# Patient Record
Sex: Female | Born: 1935 | Race: White | Hispanic: No | Marital: Married | Smoking: Never smoker
Health system: Southern US, Community
[De-identification: ages and names within clinical notes are randomized; demographics above are authoritative.]

## PROBLEM LIST (undated history)

## (undated) DIAGNOSIS — I10 Essential (primary) hypertension: Secondary | ICD-10-CM

## (undated) DIAGNOSIS — M810 Age-related osteoporosis without current pathological fracture: Secondary | ICD-10-CM

## (undated) DIAGNOSIS — G4700 Insomnia, unspecified: Secondary | ICD-10-CM

## (undated) DIAGNOSIS — M1990 Unspecified osteoarthritis, unspecified site: Secondary | ICD-10-CM

## (undated) DIAGNOSIS — E538 Deficiency of other specified B group vitamins: Secondary | ICD-10-CM

## (undated) DIAGNOSIS — E039 Hypothyroidism, unspecified: Secondary | ICD-10-CM

## (undated) DIAGNOSIS — IMO0002 Reserved for concepts with insufficient information to code with codable children: Secondary | ICD-10-CM

## (undated) DIAGNOSIS — F0280 Dementia in other diseases classified elsewhere without behavioral disturbance: Secondary | ICD-10-CM

## (undated) DIAGNOSIS — F419 Anxiety disorder, unspecified: Secondary | ICD-10-CM

## (undated) DIAGNOSIS — G309 Alzheimer's disease, unspecified: Secondary | ICD-10-CM

## (undated) DIAGNOSIS — J301 Allergic rhinitis due to pollen: Secondary | ICD-10-CM

## (undated) DIAGNOSIS — G609 Hereditary and idiopathic neuropathy, unspecified: Secondary | ICD-10-CM

## (undated) DIAGNOSIS — E785 Hyperlipidemia, unspecified: Secondary | ICD-10-CM

## (undated) DIAGNOSIS — N390 Urinary tract infection, site not specified: Secondary | ICD-10-CM

## (undated) DIAGNOSIS — D126 Benign neoplasm of colon, unspecified: Secondary | ICD-10-CM

## (undated) DIAGNOSIS — N360 Urethral fistula: Secondary | ICD-10-CM

## (undated) DIAGNOSIS — K5900 Constipation, unspecified: Secondary | ICD-10-CM

## (undated) HISTORY — DX: Dementia in other diseases classified elsewhere, unspecified severity, without behavioral disturbance, psychotic disturbance, mood disturbance, and anxiety: F02.80

## (undated) HISTORY — DX: Anxiety disorder, unspecified: F41.9

## (undated) HISTORY — DX: Benign neoplasm of colon, unspecified: D12.6

## (undated) HISTORY — DX: Hyperlipidemia, unspecified: E78.5

## (undated) HISTORY — DX: Urinary tract infection, site not specified: N39.0

## (undated) HISTORY — DX: Allergic rhinitis due to pollen: J30.1

## (undated) HISTORY — DX: Constipation, unspecified: K59.00

## (undated) HISTORY — DX: Deficiency of other specified B group vitamins: E53.8

## (undated) HISTORY — DX: Reserved for concepts with insufficient information to code with codable children: IMO0002

## (undated) HISTORY — DX: Insomnia, unspecified: G47.00

## (undated) HISTORY — DX: Essential (primary) hypertension: I10

## (undated) HISTORY — DX: Hereditary and idiopathic neuropathy, unspecified: G60.9

## (undated) HISTORY — DX: Age-related osteoporosis without current pathological fracture: M81.0

## (undated) HISTORY — DX: Urethral fistula: N36.0

## (undated) HISTORY — DX: Unspecified osteoarthritis, unspecified site: M19.90

## (undated) HISTORY — DX: Hypothyroidism, unspecified: E03.9

## (undated) HISTORY — DX: Alzheimer's disease, unspecified: G30.9

---
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
## 1970-09-01 HISTORY — PX: TUBAL LIGATION: SHX77

## 1999-12-01 HISTORY — PX: COLONOSCOPY W/ BIOPSIES AND POLYPECTOMY: SHX1376

## 1999-12-03 ENCOUNTER — Encounter (INDEPENDENT_AMBULATORY_CARE_PROVIDER_SITE_OTHER): Payer: Self-pay

## 1999-12-03 ENCOUNTER — Ambulatory Visit (HOSPITAL_COMMUNITY): Admission: RE | Admit: 1999-12-03 | Discharge: 1999-12-03 | Payer: Self-pay | Admitting: Gastroenterology

## 2000-08-18 ENCOUNTER — Other Ambulatory Visit: Admission: RE | Admit: 2000-08-18 | Discharge: 2000-08-18 | Payer: Self-pay | Admitting: *Deleted

## 2001-09-15 ENCOUNTER — Other Ambulatory Visit: Admission: RE | Admit: 2001-09-15 | Discharge: 2001-09-15 | Payer: Self-pay | Admitting: *Deleted

## 2003-03-29 ENCOUNTER — Ambulatory Visit (HOSPITAL_COMMUNITY): Admission: RE | Admit: 2003-03-29 | Discharge: 2003-03-29 | Payer: Self-pay | Admitting: Gastroenterology

## 2003-11-01 ENCOUNTER — Other Ambulatory Visit: Admission: RE | Admit: 2003-11-01 | Discharge: 2003-11-01 | Payer: Self-pay | Admitting: Family Medicine

## 2005-09-01 DIAGNOSIS — N360 Urethral fistula: Secondary | ICD-10-CM

## 2005-09-01 DIAGNOSIS — K5900 Constipation, unspecified: Secondary | ICD-10-CM

## 2005-09-01 DIAGNOSIS — M810 Age-related osteoporosis without current pathological fracture: Secondary | ICD-10-CM

## 2005-09-01 DIAGNOSIS — D126 Benign neoplasm of colon, unspecified: Secondary | ICD-10-CM

## 2005-09-01 DIAGNOSIS — N390 Urinary tract infection, site not specified: Secondary | ICD-10-CM

## 2005-09-01 DIAGNOSIS — IMO0002 Reserved for concepts with insufficient information to code with codable children: Secondary | ICD-10-CM

## 2005-09-01 DIAGNOSIS — G4700 Insomnia, unspecified: Secondary | ICD-10-CM

## 2005-09-01 HISTORY — DX: Urinary tract infection, site not specified: N39.0

## 2005-09-01 HISTORY — DX: Urethral fistula: N36.0

## 2005-09-01 HISTORY — DX: Insomnia, unspecified: G47.00

## 2005-09-01 HISTORY — DX: Benign neoplasm of colon, unspecified: D12.6

## 2005-09-01 HISTORY — DX: Reserved for concepts with insufficient information to code with codable children: IMO0002

## 2005-09-01 HISTORY — DX: Age-related osteoporosis without current pathological fracture: M81.0

## 2005-09-01 HISTORY — DX: Constipation, unspecified: K59.00

## 2006-09-01 DIAGNOSIS — J301 Allergic rhinitis due to pollen: Secondary | ICD-10-CM

## 2006-09-01 HISTORY — DX: Allergic rhinitis due to pollen: J30.1

## 2008-05-10 HISTORY — PX: COLONOSCOPY W/ BIOPSIES: SHX1374

## 2009-01-17 DIAGNOSIS — G609 Hereditary and idiopathic neuropathy, unspecified: Secondary | ICD-10-CM | POA: Insufficient documentation

## 2009-01-17 HISTORY — DX: Hereditary and idiopathic neuropathy, unspecified: G60.9

## 2010-05-03 ENCOUNTER — Encounter: Admission: RE | Admit: 2010-05-03 | Discharge: 2010-05-03 | Payer: Self-pay | Admitting: Family Medicine

## 2011-01-17 NOTE — Op Note (Signed)
Sonya Robertson, Sonya Robertson                           ACCOUNT NO.:  [id number]   MEDICAL RECORD NO.:  [id number]                   PATIENT TYPE:  AMB   LOCATION:  ENDO                                 FACILITY:  [facility]   PHYSICIAN:  Anselmo Rod, M.D.               DATE OF BIRTH:  03-12-1933   DATE OF PROCEDURE:  03/29/2003  DATE OF DISCHARGE:                                 OPERATIVE REPORT   PROCEDURE PERFORMED:  Screening colonoscopy.   ENDOSCOPIST:  Charna Elizabeth, M.D.   INSTRUMENT USED:  Olympus pediatric adjustable colonoscope changed to an  adult colonoscope.   INDICATIONS FOR PROCEDURE:  The patient is a 77 year old white female with a  history of adenomatous polyps removed in 2001.  Undergoing a repeat  colonoscopy. Rule out recurrent polyps.  There is questionable family  history of colon cancer in the paternal grandmother and family history of  breast cancer in a maternal aunt.   PREPROCEDURE PREPARATION:  Informed consent was procured from the patient.  The patient was fasted for eight hours prior to the procedure and prepped  with a bottle of magnesium citrate and a gallon of GoLYTELY the night prior  to the procedure.  She also received 400 mg of Cipro intravenously for MV  prophylaxis.   PREPROCEDURE PHYSICAL:  The patient had stable vital signs.  Neck supple.  Chest clear to auscultation.  S1 and S2 regular.  Abdomen soft with normal  bowel sounds.   DESCRIPTION OF PROCEDURE:  The patient was placed in left lateral decubitus  position and sedated with 100 mcg of fentanyl and 10 mg of Versed  intravenously.  Once the patient was adequately sedated and maintained on  low flow oxygen and continuous cardiac monitoring, the Olympus video  colonoscope was advanced from the rectum to the hepatic flexure without  difficulty.  The pediatric adjustable colonoscope was then withdrawn and the  adult colonoscope used instead.  The patient's position was changed from the  left  lateral position to the supine position and gentle abdominal pressure  was applied on several occasions to reach the cecum.  The appendicular  orifice and ileocecal valve were clearly visualized and photographed.  There  was evidence of pandiverticulosis with more prominent changes in the left  colon.  Small nonbleeding internal hemorrhoids were seen on retroflexion.  The patient had a very tortuous atonic colon.  The patient tolerated the  procedure well without complications.  No masses or polyps were seen.   IMPRESSION:  1. Pandiverticulosis with more prominent changes in the sigmoid colon.  2. Small nonbleeding internal hemorrhoids.  Very tortuous atonic colon     requiring changing of the scope from the pediatric to an adult size.  3. No masses or polyps seen.   RECOMMENDATIONS:  1. A high fiber diet with 20 to 25g of fiber in the diet, liberal fluid  intake has been recommended.  2. Repeat colorectal cancer screening is recommended in the next five years     unless the patient develops any abnormal symptoms in the interim.  3. Outpatient follow-up as need arises in the future.                                                  Anselmo Rod, M.D.    JNM/MEDQ  D:  03/29/2003  T:  03/29/2003  Job:  [id number]   cc:   Talmadge Coventry, M.D.  [address]  [place]  [place] [address]  Fax: [phone]

## 2011-09-11 ENCOUNTER — Ambulatory Visit (INDEPENDENT_AMBULATORY_CARE_PROVIDER_SITE_OTHER): Payer: BC Managed Care – PPO

## 2011-09-11 DIAGNOSIS — J440 Chronic obstructive pulmonary disease with acute lower respiratory infection: Secondary | ICD-10-CM

## 2012-01-12 ENCOUNTER — Other Ambulatory Visit: Payer: Self-pay | Admitting: Family Medicine

## 2012-01-14 NOTE — Telephone Encounter (Signed)
THIS OK TO CHANGE?

## 2012-03-09 ENCOUNTER — Telehealth: Payer: Self-pay

## 2012-03-09 NOTE — Telephone Encounter (Signed)
Sonya Robertson from [facility] [employer] called to state that the assessment that was faxed back to her did not have the attachment that it stated it should have.  Please review and call Sonya Robertson back at [phone].

## 2012-03-10 ENCOUNTER — Telehealth: Payer: Self-pay

## 2012-03-10 NOTE — Telephone Encounter (Signed)
Dr. Patsy Lager do you remember this form for pt.  If you do, do you remember putting a yellow sticky note on it?  If so i can look through the scan piles to find it to resend.  (I checked in chart and no papers were put in chart)

## 2012-03-10 NOTE — Telephone Encounter (Signed)
LMOM for [facility] to [facility] Stated on machine Pt has not been seen here at our office since 09/11/11. Eileen Stanford

## 2012-03-10 NOTE — Telephone Encounter (Signed)
Sonya Robertson FROM [facility] CALLED IN REGARDS TO THE PATIENT.  DR. Patsy Lager HAD FAXED OVER AN ASSESSMENT.  ON THE FIRST PAGE IT SAID SEE ATTACHMENT.  THEY DID NOT RECEIVE THE ATTACHMENT .  PLEASE CALL HER AT [phone]

## 2012-03-10 NOTE — Telephone Encounter (Signed)
See notes under phone message on 03/10/12.

## 2012-03-10 NOTE — Telephone Encounter (Signed)
LMOM for Sonya Robertson to [facility]

## 2012-03-10 NOTE — Telephone Encounter (Signed)
Sonya Robertson spoke w/ Sonya Robertson and got completed form faxed back to her at [facility] w/ attachment.

## 2012-03-10 NOTE — Telephone Encounter (Signed)
I do not remember this particular form.  Would they like to fax [place] another copy and I will do it again?  Please call [facility] and ask them

## 2012-07-05 ENCOUNTER — Other Ambulatory Visit: Payer: Self-pay | Admitting: Family Medicine

## 2012-07-05 NOTE — Telephone Encounter (Signed)
Chart pulled to PA pool at nurses station [phone]

## 2012-07-06 ENCOUNTER — Other Ambulatory Visit: Payer: Self-pay

## 2012-07-06 MED ORDER — LEVOTHYROXINE SODIUM 88 MCG PO TABS: 88.0000 ug | ORAL_TABLET | Freq: Every day | ORAL | Status: DC

## 2012-07-06 MED ORDER — AMLODIPINE BESYLATE 5 MG PO TABS: 5.0000 mg | ORAL_TABLET | Freq: Every day | ORAL | Status: DC

## 2012-07-08 ENCOUNTER — Telehealth: Payer: Self-pay

## 2012-07-08 ENCOUNTER — Ambulatory Visit (INDEPENDENT_AMBULATORY_CARE_PROVIDER_SITE_OTHER): Payer: Medicare Other | Admitting: Family Medicine

## 2012-07-08 VITALS — BP 128/74 | HR 69 | Temp 97.9°F | Resp 16 | Wt 122.0 lb

## 2012-07-08 DIAGNOSIS — R636 Underweight: Secondary | ICD-10-CM

## 2012-07-08 DIAGNOSIS — E039 Hypothyroidism, unspecified: Secondary | ICD-10-CM

## 2012-07-08 DIAGNOSIS — K1379 Other lesions of oral mucosa: Secondary | ICD-10-CM

## 2012-07-08 DIAGNOSIS — K1370 Unspecified lesions of oral mucosa: Secondary | ICD-10-CM

## 2012-07-08 DIAGNOSIS — I10 Essential (primary) hypertension: Secondary | ICD-10-CM

## 2012-07-08 LAB — COMPREHENSIVE METABOLIC PANEL
Albumin: 4.4 g/dL (ref 3.5–5.2)
CO2: 30 mEq/L (ref 19–32)
Glucose, Bld: 77 mg/dL (ref 70–99)
Potassium: 3.9 mEq/L (ref 3.5–5.3)
Sodium: 141 mEq/L (ref 135–145)
Total Protein: 6.7 g/dL (ref 6.0–8.3)

## 2012-07-08 LAB — POCT CBC
Granulocyte percent: 61.1 %G (ref 37–80)
HCT, POC: 40.7 % (ref 37.7–47.9)
Hemoglobin: 12.6 g/dL (ref 12.2–16.2)
Lymph, poc: 1.4 (ref 0.6–3.4)
MCHC: 31 g/dL — AB (ref 31.8–35.4)
POC Granulocyte: 2.7 (ref 2–6.9)

## 2012-07-08 NOTE — Telephone Encounter (Signed)
Sonya Robertson CALLED AGAIN AND WOULD LIKE A CALL BACK FROM DR COPLAND, THEY ARE THINKING ABOUT COMING IN, BUT WOULD LIKE TO SPEAK WITH HER FIRST. PLEASE CALL [phone]

## 2012-07-08 NOTE — Progress Notes (Signed)
[facility] [address], [place] [address] [phone]  Date:  07/08/2012   Name:  Sonya Robertson   DOB:  06/14/1933   MRN:  [id number]  PCP:  No primary provider on file.    Chief Complaint: Follow-up and Medication Refill   History of Present Illness:  Sonya Robertson is a 78 y.o. very pleasant female patient who presents with the following:  Here today for a follow- up visit. She has been doing well except for a runny nose today.  She has not been seen in about a year and needed to have her labs updated.  She is not fasting today.   She and her husband have moved to [place]- they are in independent living but are enjoying the meals at their new home.    There is no problem list on file for this patient.   Past Medical History  Diagnosis Date  . Arthritis     History reviewed. No pertinent past surgical history.  History  Substance Use Topics  . Smoking status: Never Smoker   . Smokeless tobacco: Not on file  . Alcohol Use: Yes    History reviewed. No pertinent family history.  Allergies not on file  Medication list has been reviewed and updated.  Current Outpatient Prescriptions on File Prior to Visit  Medication Sig Dispense Refill  . amLODipine (NORVASC) 5 MG tablet TAKE 1 TABLET DAILY FOR BLOOD PRESSURE.  30 tablet  0  . amLODipine (NORVASC) 5 MG tablet Take 1 tablet (5 mg total) by mouth daily.  30 tablet  0  . levothyroxine (SYNTHROID, LEVOTHROID) 88 MCG tablet Take 1 tablet (88 mcg total) by mouth daily.  30 tablet  0  . SYNTHROID 88 MCG tablet TAKE (1) TABLET DAILY FOR THYROID.  90 tablet  0    Review of Systems:  As per HPI- otherwise negative. She reports having a good appetite and is eating ok in her opinion.  Her weight has been stable over the last year or so, but has been trending down over the last 5 years.     Physical Examination: Filed Vitals:   07/08/12 1517  BP: 128/74  Pulse: 69  Temp: 97.9 F (36.6 C)    Resp: 16   Filed Vitals:   07/08/12 1517  Weight: 122 lb (55.339 kg)   There is no height on file to calculate BMI. Ideal Body Weight:    GEN: WDWN, NAD, Non-toxic, A & O x 3, slim build  HEENT: Atraumatic, Normocephalic. Neck supple. No masses, No LAD.  Bilateral TM wnl, oropharynx benign- however I do note that her uvula is pulled to the right when she raises her soft palate.  PEERL,EOMI.   Ears and Nose: No external deformity. CV: RRR, No M/G/R. No JVD. No thrill. No extra heart sounds. PULM: CTA B, no wheezes, crackles, rhonchi. No retractions. No resp. distress. No accessory muscle use. ABD: S, NT, ND EXTR: No c/c/e NEURO Normal gait.  PSYCH: Normally interactive. Conversant. Not depressed or anxious appearing.  Calm demeanor.    Assessment and Plan: 1. Hypothyroidism  TSH  2. Underweight  POCT CBC, Comprehensive metabolic panel  3. Soft palate paralysis      Check labs today to ensure we do not need to adjust her thyroid medication.  Encouraged her to add a snack/ ensure shake to her daily diet.  Went over the finding noted above with her and her husband.  They are  not sure if this is new or old and she has not noted anything amiss. She has no trouble swallowing.  Discussed sending her to ENT for further evaluation.  She is not sure if she wants to pursue this further right now- will talk with her when I call regarding her labs in a few days.    Abbe Amsterdam, MD

## 2012-07-08 NOTE — Telephone Encounter (Signed)
PATIENT'S SPOUSE CALLED IN REGARDS TO KNOWING HOW TO GET HIS WIFE'S RX REFILL DONE AND TO SPEAK TO DR.COPLAND OR HER NURSE ABOUT SOME QUESTIONS THAT THEY HAVE. THANK YOU!

## 2012-07-08 NOTE — Telephone Encounter (Signed)
Patient is here now to be seen 

## 2012-07-09 LAB — TSH: TSH: 1.586 u[IU]/mL (ref 0.350–4.500)

## 2012-07-10 ENCOUNTER — Encounter: Payer: Self-pay | Admitting: Family Medicine

## 2012-07-10 MED ORDER — LEVOTHYROXINE SODIUM 88 MCG PO TABS: 88.0000 ug | ORAL_TABLET | Freq: Every day | ORAL | Status: DC

## 2012-07-10 MED ORDER — AMLODIPINE BESYLATE 5 MG PO TABS: 5.0000 mg | ORAL_TABLET | Freq: Every day | ORAL | Status: DC

## 2012-07-10 NOTE — Addendum Note (Signed)
Addended by: Abbe Amsterdam C on: 07/10/2012 05:56 PM   Modules accepted: Orders

## 2012-12-13 ENCOUNTER — Non-Acute Institutional Stay: Payer: Medicare Other | Admitting: Internal Medicine

## 2012-12-13 ENCOUNTER — Encounter: Payer: Self-pay | Admitting: Internal Medicine

## 2012-12-13 VITALS — BP 118/62 | HR 68 | Resp 14 | Ht 65.0 in | Wt 118.0 lb

## 2012-12-13 DIAGNOSIS — G609 Hereditary and idiopathic neuropathy, unspecified: Secondary | ICD-10-CM

## 2012-12-13 DIAGNOSIS — E039 Hypothyroidism, unspecified: Secondary | ICD-10-CM

## 2012-12-13 DIAGNOSIS — I10 Essential (primary) hypertension: Secondary | ICD-10-CM

## 2012-12-13 DIAGNOSIS — G309 Alzheimer's disease, unspecified: Secondary | ICD-10-CM

## 2012-12-13 DIAGNOSIS — E785 Hyperlipidemia, unspecified: Secondary | ICD-10-CM

## 2012-12-13 DIAGNOSIS — E538 Deficiency of other specified B group vitamins: Secondary | ICD-10-CM

## 2012-12-13 DIAGNOSIS — F0280 Dementia in other diseases classified elsewhere without behavioral disturbance: Secondary | ICD-10-CM

## 2012-12-19 ENCOUNTER — Encounter: Payer: Self-pay | Admitting: Internal Medicine

## 2012-12-19 DIAGNOSIS — E538 Deficiency of other specified B group vitamins: Secondary | ICD-10-CM | POA: Insufficient documentation

## 2012-12-19 DIAGNOSIS — I10 Essential (primary) hypertension: Secondary | ICD-10-CM | POA: Insufficient documentation

## 2012-12-19 DIAGNOSIS — F0280 Dementia in other diseases classified elsewhere without behavioral disturbance: Secondary | ICD-10-CM | POA: Insufficient documentation

## 2012-12-19 DIAGNOSIS — E039 Hypothyroidism, unspecified: Secondary | ICD-10-CM | POA: Insufficient documentation

## 2012-12-19 DIAGNOSIS — E785 Hyperlipidemia, unspecified: Secondary | ICD-10-CM | POA: Insufficient documentation

## 2012-12-19 NOTE — Progress Notes (Signed)
Date: 12/19/2012  MRN:  [id number] Name:  Sonya Robertson Sex:  female Age:  79 y.o. DOB:27-Mar-1933   [facility] #:                      [phone] Facility/Room; [facility] Level Of Care: Independent with the assistance of her husband Provider: A. Sanna Porcaro  Emergency Contacts: Contact Information   Name Relation Home Work Mobile   Defrank,DAVID  [phone]        Code Status: Living will  Allergies: Allergies  Allergen Reactions  . Codeine   . Demerol (Meperidine)   . Sulfa Antibiotics      Chief Complaint  Patient presents with  . Medical Managment of Chronic Issues    Alzheimer's, anxiety, thyroid. Saw Claudette 10/20/12 to Re-Est     HPI: Patient is here to establish herself in our practice. She recently had an exam done by Maxwell Marion, NP.  There are few complaints today. She is accompanied by her husband. She swells throughout the exam and says she is doing fine. Her husband has no specific complaints either. Multiple chronic issues are outlined in her past medical history. They all seem to be stable at this time.  Past Medical History  Diagnosis Date  . Arthritis   . Alzheimer's disease   . Unspecified hereditary and idiopathic peripheral neuropathy 01/17/2009  . Allergic rhinitis due to pollen 2008  . Hyperlipidemia   . Urinary tract infection, site not specified 2007  . Urethral fistula 2007  . Benign neoplasm of colon 2007  . Anxiety   . Unspecified constipation 2007  . Degeneration of intervertebral disc, site unspecified 2007  . Senile osteoporosis 2007  . Insomnia, unspecified 2007  . Unspecified hypothyroidism   . B12 deficiency   . Unspecified essential hypertension     Past Surgical History  Procedure Laterality Date  . Tonsillectomy and adenoidectomy  1940  . Tubal ligation  1972  . Colonoscopy w/ biopsies and polypectomy  12/1999    adenomatous Dr. Arty Baumgartner     Procedures: 12/1999 Colonoscopy Dr. Arty Baumgartner 10-07-2001 Bone Density Dr. Yolanda Bonine 2004  Colonoscopy Dr. Arty Baumgartner 01-17-05 Mammogram 12/2005 Mammogram 03/20/2006 Bone Density osteopenic 11/06/2006 Right Foot x-ray degenerative changes, osteopenia, on acute fracture or dislocation 03/01/2007-Mammogram negative  03/09/2008 Mammogram negative 03/09/2008 Bone Density Osteopenic 05/10/2008 Colonoscopy pandiverticulosis with more prominent changes in the sigmoid colon. Prominent internal hemorrhoids. One small sessile polyp Dr. Charna Elizabeth 03/15/2009 Mammogram negative Consultants:   GI- Dr Loreta Ave Neurology- Dr Anne Hahn Orthopedic Dr. Lunette Stands   Current Outpatient Prescriptions  Medication Sig Dispense Refill  . aspirin 81 MG tablet Take 81 mg by mouth daily.      . Cyanocobalamin (NASCOBAL) 500 MCG/0.1ML SOLN Place 500 mcg into the nose. One spray intranasally once a week      . Multiple Vitamin (MULTIVITAMIN) tablet Take 1 tablet by mouth daily.      Marland Kitchen amLODipine (NORVASC) 5 MG tablet Take 1 tablet (5 mg total) by mouth daily.  30 tablet  11  . donepezil (ARICEPT) 23 MG TABS tablet 23 mg. Take one tablet daily for memory      . levothyroxine (SYNTHROID, LEVOTHROID) 88 MCG tablet Take 1 tablet (88 mcg total) by mouth daily.  30 tablet  11   No current facility-administered medications for this visit.    Immunization History  Administered Date(s) Administered  . Influenza Whole 06/01/2012  . Pneumococcal Polysaccharide 09/01/1996  . Td 09/01/2004  .  Zoster 01/08/2007     Diet: regular  History  Substance Use Topics  . Smoking status: Former Smoker    Quit date: 09/19/1953  . Smokeless tobacco: Never Used  . Alcohol Use: Yes     Comment: wine with dinner    Family History  Problem Relation Age of Onset  . Cancer Mother     COLON  . Heart disease Father     MI     Constitutional: negative Eyes: positive for contacts/glasses Ears, nose, mouth, throat, and face: positive for hearing loss, negative for earaches and snoring Respiratory: negative Cardiovascular:  negative Gastrointestinal: positive for constipation, negative for diarrhea, dyspepsia, melena and nausea Genitourinary:positive for urinary incontinence, negative for dysuria and frequency Integument/breast: negative Hematologic/lymphatic: negative Musculoskeletal:positive for arthralgias and bone pain, negative for bone pain and neck pain Neurological: positive for memory problems, negative for dizziness, tremors and vertigo Behavioral/Psych: negative Endocrine: positive for hypothyroidism Allergic/Immunologic: negative  Vital signs: BP 118/62  Pulse 68  Resp 14  Ht 5\' 5"  (1.651 m)  Wt 118 lb (53.524 kg)  BMI 19.64 kg/m2   General Appearance:    Alert, cooperative, no distress, appears stated age  Head:    Normocephalic, without obvious abnormality, atraumatic  Eyes:    PERRL, conjunctiva/corneas clear, EOM's intact, fundi    benign, both eyes.Wearing prescription lenses.  Ears:    Normal TM's and external ear canals, both ears  Nose:   Nares normal, septum midline, mucosa normal, no drainage    or sinus tenderness  Throat:   Lips, mucosa, and tongue normal; teeth and gums normal  Neck:   Supple, symmetrical, trachea midline, no adenopathy;    thyroid:  no enlargement/tenderness/nodules; no carotid   bruit or JVD  Back:     Symmetric, no curvature, ROM normal, no CVA tenderness  Lungs:     Clear to auscultation bilaterally, respirations unlabored  Chest Wall:    No tenderness or deformity   Heart:    Regular rate and rhythm, S1 and S2 normal, no murmur, rub   or gallop  Breast Exam:    No tenderness, masses, or nipple abnormality  Abdomen:     Soft, non-tender, bowel sounds active all four quadrants,    no masses, no organomegaly  Genitalia:    Normal female without lesion, discharge or tenderness  Rectal:    Normal tone, normal prostate, no masses or tenderness;   guaiac negative stool  Spine: Scoliosis   Extremities:   Extremities normal, atraumatic, no cyanosis or  edema  Pulses:   absent popliteal pulses. Bilaterally absent dorsalis pedis pulses. Trace edema on the left. Superficial varicosities bilaterally.   Skin:   Skin color, texture, turgor normal, no rashes or lesions  Lymph nodes:   Cervical, supraclavicular, and axillary nodes normal  Neurologic:   CNII-XII intact, normal strength, sensation and reflexes    Throughout. Loss of memory. Heavily dependent on her husband     Screening Score  MMS  [id number]  PHQ2 0  PHQ9     Fall Risk    BIMS    Annual summary: Hospitalizations: none   Infection History: None of significance Functional assessment: Patient is able to dress herself and attend to her own toileting. She is dependent upon her husband for guidance, remembering to take her medications, and meal preparation. Areas of potential improvement: Functioning at highest achievable level Rehabilitation Potential: None likely Prognosis for survival: Good Plan: Problem List Items Addressed This Visit  ICD-9-CM   Alzheimer's disease - Primary     This is the primary diagnosis at this time. This has been progressive over at least the last 4 years. Her Husband has cared for her. Her needs have increased significantly during this time. The move to [facility] was in part due to her progressive dementia    Relevant Medications      donepezil (ARICEPT) 23 MG TABS tablet   Unspecified hereditary and idiopathic peripheral neuropathy    Patient has a mild neuropathy. Is not painful. It has been attributed to possible B12 deficiency in the past.    Relevant Medications      donepezil (ARICEPT) 23 MG TABS tablet   Unspecified essential hypertension    Under excellent control at this time no active treatment.    Relevant Medications      aspirin 81 MG tablet   B12 deficiency    Patient is taking Nascobal    Hyperlipidemia    Followup lab as needed    Relevant Medications      aspirin 81 MG tablet   Unspecified hypothyroidism    Currently  using Levoxyl 88 mcg daily. Followup lab is needed.

## 2012-12-19 NOTE — Patient Instructions (Signed)
Continue current medications  Return in 6 months

## 2013-01-10 ENCOUNTER — Telehealth: Payer: Self-pay | Admitting: Nurse Practitioner

## 2013-01-10 NOTE — Telephone Encounter (Signed)
R/S appt

## 2013-01-18 ENCOUNTER — Ambulatory Visit: Payer: Self-pay | Admitting: Nurse Practitioner

## 2013-01-29 ENCOUNTER — Other Ambulatory Visit: Payer: Self-pay | Admitting: Neurology

## 2013-02-16 ENCOUNTER — Encounter: Payer: Self-pay | Admitting: Geriatric Medicine

## 2013-02-16 ENCOUNTER — Non-Acute Institutional Stay: Payer: Medicare Other | Admitting: Geriatric Medicine

## 2013-02-16 VITALS — BP 128/60 | HR 56 | Ht 65.0 in | Wt 120.0 lb

## 2013-02-16 DIAGNOSIS — I10 Essential (primary) hypertension: Secondary | ICD-10-CM

## 2013-02-16 DIAGNOSIS — G309 Alzheimer's disease, unspecified: Secondary | ICD-10-CM

## 2013-02-16 NOTE — Assessment & Plan Note (Signed)
Functional status with minimal decline- spouse supervises showering, all other ADLS independent. . Language skills nearly intact, some difficulty word finding. No behavioral issues; no wandering. Will start participation in [employer] next month, family has discussed caregiver assistance at home if needed in the future. Repeat MMSE next visit w/ Dr.Green

## 2013-02-16 NOTE — Assessment & Plan Note (Signed)
Amlodipine stopped 11/2012. BP satisfactory.

## 2013-02-16 NOTE — Progress Notes (Signed)
Patient ID: Sonya Robertson, female   DOB: 1933/04/13, 79 y.o.   MRN: [id number] [facility] [phone])  Chief Complaint  Patient presents with  . Medical Managment of Chronic Issues    thyroid, anxiety, Alzheimers    HPI: This is a 79 y.o. female resident of [facility], Independent Living section evaluated today for management of ongoing medical issues.  Pt's spouse reports memory/ functional issues are pretty stable. He notes pt has difficult time with new environments; requires assistance with mechanics. No acute issues at home.  Allergies  Allergies  Allergen Reactions  . Codeine   . Demerol (Meperidine)   . Sulfa Antibiotics    Medications  Reviewed  Data Reviewed    Lab:  Reviewed 07/2012    Review of Systems  DATA OBTAINED: from patient, family member (spouse) GENERAL: Feels well   No fevers, fatigue, change in appetite or weight SKIN: No itch, rash or open wounds EYES: No eye pain,   No change in vision EARS: No earache, tinnitus, change in hearing, wears Hearing aides NOSE: No congestion, drainage or bleeding MOUTH/THROAT: No mouth or tooth pain  No sore throat No difficulty chewing or swallowing RESPIRATORY: No cough, wheezing, SOB CARDIAC: No chest pain, palpitations  No edema. GI: No abdominal pain  No N/V/D or constipation  No heartburn or reflux  GU: No dysuria, frequency or urgency  No change in urine volume or character MUSCULOSKELETAL: No joint pain, swelling or stiffness  Chronic lower rt. back pain  No muscle ache, pain, weakness  Gait is steady  No recent falls.  NEUROLOGIC: No dizziness, fainting, headache,  No change in mental status(dementia).  PSYCHIATRIC: No feelings of anxiety, depression Sleeps well.  No behavior issue.   Physical Exam Filed Vitals:   02/16/13 1547  BP: 128/60  Pulse: 56  Height: 5\' 5"  (1.651 m)  Weight: 120 lb (54.432 kg)   Body mass index is 19.97 kg/(m^2).  GENERAL APPEARANCE: No  acute distress, appropriately groomed, normal body habitus. Alert, pleasant, conversant. HEAD: Normocephalic, atraumatic EYES: Conjunctiva/lids clear. Pupils round, reactive. Marland Kitchen  EARS: External exam WNL, Aide in each ear  Hearing grossly normal. NOSE: No deformity or discharge. MOUTH/THROAT: Lips w/o lesions. Oral mucosa, tongue moist, w/o lesion. Oropharynx w/o redness or lesions.  NECK: Supple, full ROM. No thyroid tenderness, enlargement or nodule LYMPHATICS: No head, neck or supraclavicular adenopathy RESPIRATORY: Breathing is even, unlabored. Lung sounds are clear and full.  CARDIOVASCULAR: Heart RRR. No murmur or extra heart sounds  EDEMA: No peripheral edema. GASTROINTESTINAL: Abdomen is soft, non-tender, not distended w/ normal bowel sounds.  MUSCULOSKELETAL: Moves all extremities with full ROM, strength and tone. Back is without kyphosis, spinal process tenderness. Scoliosis present. Gait is steady NEUROLOGIC: Oriented to time, place, person. Speech clear, no tremor. Marland Kitchen PSYCHIATRIC: Mood and affect appropriate to situation  ASSESSMENT/PLAN  .Alzheimer's disease Functional status with minimal decline- spouse supervises showering, all other ADLS independent. . Language skills nearly intact, some difficulty word finding. No behavioral issues; no wandering. Will start participation in [employer] next month, family has discussed caregiver assistance at home if needed in the future. Repeat MMSE next visit w/ Dr.Green  Unspecified essential hypertension Amlodipine stopped 11/2012. BP satisfactory.   Follow up: 06/2013 Dr.Green w/ lab. 09/2013 [facility]  Casanova Schurman T.Theia Dezeeuw, NP-C 02/16/2013

## 2013-02-25 ENCOUNTER — Other Ambulatory Visit: Payer: Self-pay | Admitting: Neurology

## 2013-04-13 ENCOUNTER — Encounter: Payer: Self-pay | Admitting: Nurse Practitioner

## 2013-04-13 ENCOUNTER — Ambulatory Visit (INDEPENDENT_AMBULATORY_CARE_PROVIDER_SITE_OTHER): Payer: Medicare Other | Admitting: Nurse Practitioner

## 2013-04-13 VITALS — BP 113/59 | HR 56 | Ht 66.0 in | Wt 117.0 lb

## 2013-04-13 DIAGNOSIS — F0280 Dementia in other diseases classified elsewhere without behavioral disturbance: Secondary | ICD-10-CM

## 2013-04-13 MED ORDER — DONEPEZIL HCL 23 MG PO TABS: 23.0000 mg | ORAL_TABLET | Freq: Every day | ORAL | Status: DC

## 2013-04-13 NOTE — Progress Notes (Signed)
Reason for visit follow up for memory loss  HPI: [name] returns for followup her daughter and her husband. She was last seen by Dr. Vickey Huger 07/23/2012.  She has problems expressing herself and sometimes seems to have difficulties understanding, too. She loses objects, misplacing things in the house, word finding difficulties have made it harder to complete a sentence  and sometimes she forgets what she intended to say at all. 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 She has two Master's degrees.  Patient is retired, as is her husband.  She has routines for meals, bedtimes. She doesn't forget meals,  likes to travel. Her husband reports her being socially less interactive and no longer involved in her favorite clubs,  less cooking, less independent  due to back pain, hearing loss, neuropathy were all blamed, but he feels these last two years have shown dementia.  She gave the checkbook balancing to her husband, uses the computer much less.  She is afraid of making a mistake.  Her daughter noted that her mother appears anxious, tense, worried, and her physical conditions seem not to be contributing to this. Anxious, she misses cues, for ex. searching around for the  the handbag right in front of her. MRI of the brain with mild atrophy and SVD. EEG with slowing.    Also with  B 12 deficiency and takes the nasal spray.  04/13/13:  Patient  returns for MMSE now at 15 -30. GDS=0. She is living at [facility]. No longer cooking for themselves , has personal trainer 3 times a week and goes to adult center for enrichment 2 times a week. Aricept was increased to 23 mg at last visit and memory is stable according to daughter and husband   ROS:  Weight loss, ringing in the ears, allergies, anxiety, memory loss   Medications Current Outpatient Prescriptions on File Prior to Visit  Medication Sig Dispense Refill  . aspirin 81 MG tablet Take 81 mg  by mouth daily.      . Cyanocobalamin (NASCOBAL) 500 MCG/0.1ML SOLN Place 500 mcg into the nose. One spray intranasally once a week      . donepezil (ARICEPT) 23 MG TABS tablet TAKE 1 TABLET ONCE DAILY FOR MEMORY.  90 tablet  1  . levothyroxine (SYNTHROID, LEVOTHROID) 88 MCG tablet Take 1 tablet (88 mcg total) by mouth daily.  30 tablet  11  . Multiple Vitamin (MULTIVITAMIN) tablet Take 1 tablet by mouth daily.       No current facility-administered medications on file prior to visit.    Allergies  Allergies  Allergen Reactions  . Codeine   . Demerol [Meperidine]   . Sulfa Antibiotics     Physical Exam General: well developed, frail female  seated, in no evident distress Head: head normocephalic and atraumatic. Oropharynx benign Neck: supple with no carotid  bruits Cardiovascular: regular rate and rhythm, no murmurs  Neurologic Exam Mental Status: Awake and  alert. MMSE 15/30 missing items in orientation, calculation, recall and unable to copy a figure . Follows all commands. Speech and language normal   Cranial Nerves: Pupils equal, briskly reactive to light. Extraocular movements full without nystagmus. Visual fields full to confrontation. Right ptosis pre-existing Hearing intact and symmetric to finger snap. Facial sensation intact. Face, tongue, palate move normally and symmetrically. Neck flexion and extension normal.  Motor: Normal bulk and tone. Normal strength in all tested extremity muscles.No focal weakness Sensory.: intact  to touch and pinprick and vibratory.  Coordination: Rapid alternating movements normal in all extremities. Finger-to-nose and heel-to-shin performed accurately bilaterally. No dysmetria Gait and Station: Arises from chair without difficulty. Stance is normal. Gait demonstrates normal stride length and balance . No assistive device .  Reflexes: 2+ and symmetric. Toes downgoing.     ASSESSMENT: Dementia currently on Aricept 23 mg daily without side  effects. Denies hallucinations    PLAN: Pt will continue Aricept at current dose, will refill Continue exercise for overall good health F/U in 6 months, next visit with Dr. Vickey Huger PQRS data obtained  Sonya Robertson, GNP-BC APRN

## 2013-04-13 NOTE — Patient Instructions (Addendum)
Pt will continue Aricept at current dose, will refill Continue exercise for overall good health F/U in 6 months

## 2013-05-11 ENCOUNTER — Non-Acute Institutional Stay: Payer: Medicare Other | Admitting: Geriatric Medicine

## 2013-05-11 ENCOUNTER — Encounter: Payer: Self-pay | Admitting: Geriatric Medicine

## 2013-05-11 VITALS — BP 144/70 | HR 56 | Temp 96.3°F | Ht 66.0 in | Wt 116.0 lb

## 2013-05-11 DIAGNOSIS — K5900 Constipation, unspecified: Secondary | ICD-10-CM

## 2013-05-11 NOTE — Progress Notes (Signed)
Patient ID: Sonya Robertson, female   DOB: 1933-06-23, 79 y.o.   MRN: [id number] [facility] [phone])  Chief Complaint  Patient presents with  . Medical Managment of Chronic Issues    hard little stools for one month     HPI: This is a 79 y.o. female resident of [facility],  Independent Living  section.  Evaluation is requested today due to abnormal stools. Patient reports she passes hard brown stools several times a day. Sometimes feels the urge to evacuate but has not passed any stool. Patient denies any abdominal pain nausea vomiting or change in appetite. Spouse reports that patient does not drink very much water during the day, just sips.   Allergies  Allergies  Allergen Reactions  . Codeine   . Demerol [Meperidine]   . Sulfa Antibiotics    Medications  Reviewed  Data Reviewed    Lab:  Reviewed 07/2012    Review of Systems  DATA OBTAINED: from patient, family member (spouse) GENERAL: Feels well   No fevers, fatigue, change in appetite or weight GI: No abdominal pain  No N/V/D or constipation  No heartburn or reflux  GU: No dysuria, frequency or urgency  No change in urine volume or character MUSCULOSKELETAL: No joint pain, swelling or stiffness  Chronic lower rt. back pain  No muscle ache, pain, weakness  Gait is steady  No recent falls.  NEUROLOGIC: No dizziness, fainting, headache,  No change in mental status(dementia).  PSYCHIATRIC: No feelings of anxiety, depression Sleeps well.  No behavior issue.   Physical Exam Filed Vitals:   05/11/13 1512  BP: 144/70  Pulse: 56  Temp: 96.3 F (35.7 C)  TempSrc: Oral  Height: 5\' 6"  (1.676 m)  Weight: 116 lb (52.617 kg)   Body mass index is 18.73 kg/(m^2).  GENERAL APPEARANCE: No acute distress, appropriately groomed, normal body habitus. Alert, pleasant, conversant. HEAD: Normocephalic, atraumatic EYES: Conjunctiva/lids clear.   EARS:  Aide in each ear  Hearing grossly  normal. RESPIRATORY: Breathing is even, unlabored. GASTROINTESTINAL: Abdomen is soft, non-tender, not distended w/ normal bowel sounds on right, quiet on left side.  MUSCULOSKELETAL: Moves all extremities with full ROM, strength and tone. Back is without kyphosis, spinal process tenderness. Scoliosis present. Gait is steady NEUROLOGIC: Oriented to time, place, person. Speech clear, no tremor. Marland Kitchen PSYCHIATRIC: Mood and affect appropriate to situation  ASSESSMENT/PLAN  .No problem-specific assessment & plan notes found for this encounter.  Follow up: 06/2013 Dr.Green w/ lab. 09/2013 [facility]  Rande Dario T.Arabel Barcenas, NP-C 05/11/2013

## 2013-06-07 LAB — HEPATIC FUNCTION PANEL
ALK PHOS: 36 U/L (ref 25–125)
ALK PHOS: 36 U/L (ref 25–125)
ALT: 15 U/L (ref 7–35)
ALT: 15 U/L (ref 7–35)
AST: 20 U/L (ref 13–35)
AST: 20 U/L (ref 13–35)
Bilirubin, Total: 0.7 mg/dL

## 2013-06-07 LAB — BASIC METABOLIC PANEL
BUN: 21 mg/dL (ref 4–21)
BUN: 21 mg/dL (ref 4–21)
CREATININE: 0.7 mg/dL (ref 0.5–1.1)
Creatinine: 0.7 mg/dL (ref 0.5–1.1)
GLUCOSE: 80 mg/dL
Glucose: 80 mg/dL
POTASSIUM: 3.9 mmol/L (ref 3.4–5.3)
POTASSIUM: 3.9 mmol/L (ref 3.4–5.3)
SODIUM: 141 mmol/L (ref 137–147)
Sodium: 141 mmol/L (ref 137–147)

## 2013-06-07 LAB — LIPID PANEL
CHOLESTEROL: 274 mg/dL — AB (ref 0–200)
CHOLESTEROL: 274 mg/dL — AB (ref 0–200)
HDL: 112 mg/dL — AB (ref 35–70)
HDL: 112 mg/dL — AB (ref 35–70)
LDL CALC: 149 mg/dL
LDL Cholesterol: 149 mg/dL
LDL/HDL RATIO: 2.4
TRIGLYCERIDES: 67 mg/dL (ref 40–160)
Triglycerides: 67 mg/dL (ref 40–160)

## 2013-06-07 LAB — TSH
TSH: 3.2 u[IU]/mL (ref 0.41–5.90)
TSH: 3.2 u[IU]/mL (ref 0.41–5.90)

## 2013-06-13 ENCOUNTER — Encounter: Payer: Self-pay | Admitting: Internal Medicine

## 2013-06-13 ENCOUNTER — Non-Acute Institutional Stay: Payer: Medicare Other | Admitting: Internal Medicine

## 2013-06-13 VITALS — BP 140/64 | HR 60 | Ht 66.0 in | Wt 120.0 lb

## 2013-06-13 DIAGNOSIS — I10 Essential (primary) hypertension: Secondary | ICD-10-CM

## 2013-06-13 DIAGNOSIS — E785 Hyperlipidemia, unspecified: Secondary | ICD-10-CM

## 2013-06-13 DIAGNOSIS — F0280 Dementia in other diseases classified elsewhere without behavioral disturbance: Secondary | ICD-10-CM

## 2013-06-13 DIAGNOSIS — K5900 Constipation, unspecified: Secondary | ICD-10-CM

## 2013-06-13 DIAGNOSIS — E039 Hypothyroidism, unspecified: Secondary | ICD-10-CM

## 2013-06-13 NOTE — Progress Notes (Signed)
  Subjective:    Patient ID: Sonya Robertson, female    DOB: 1933/08/06, 79 y.o.   MRN: [id number]  HPI  Unspecified essential hypertension: controlled  Alzheimer's disease; nchanged  Hyperlipidemia: very high HDL and high LDL  Unspecified constipation: improved  Unspecified hypothyroidism: corrected on supplement   Current Outpatient Prescriptions on File Prior to Visit  Medication Sig Dispense Refill  . aspirin 81 MG tablet Take 81 mg by mouth daily.      . Cyanocobalamin (NASCOBAL) 500 MCG/0.1ML SOLN Place 500 mcg into the nose. One spray intranasally once a week      . donepezil (ARICEPT) 23 MG TABS tablet Take 1 tablet (23 mg total) by mouth daily.  90 tablet  1  . levothyroxine (SYNTHROID, LEVOTHROID) 88 MCG tablet Take 1 tablet (88 mcg total) by mouth daily.  30 tablet  11  . Multiple Vitamin (MULTIVITAMIN) tablet Take 1 tablet by mouth daily.       No current facility-administered medications on file prior to visit.    Review of Systems  Constitutional: Negative for fever, chills, diaphoresis, activity change, appetite change, fatigue and unexpected weight change.  HENT: Negative for congestion, ear discharge, ear pain, hearing loss, mouth sores and nosebleeds.   Eyes: Positive for visual disturbance.  Respiratory: Negative for apnea, choking and wheezing.   Cardiovascular: Negative for chest pain, palpitations and leg swelling.  Gastrointestinal: Negative for abdominal pain and abdominal distention.  Endocrine: Negative.   Genitourinary: Negative.   Musculoskeletal: Positive for arthralgias and back pain. Negative for gait problem, myalgias, neck pain and neck stiffness.  Skin: Negative for color change.  Neurological:       Memory deficit. Absent vibratory sense in toes, but positive at ankles.  Psychiatric/Behavioral: Negative.        Objective:   Physical Exam  Constitutional: She appears well-developed and well-nourished. No distress.  Thin.  HENT:  Head:  Normocephalic and atraumatic.  Right Ear: External ear normal.  Left Ear: External ear normal.  Nose: Nose normal.  Mouth/Throat: Oropharynx is clear and moist.  Eyes: Conjunctivae and EOM are normal. Pupils are equal, round, and reactive to light.  Corrective  Lenses.  Neck: Neck supple. No JVD present. No tracheal deviation present. No thyromegaly present.  Cardiovascular: Normal rate, regular rhythm and normal heart sounds.  Exam reveals no gallop and no friction rub.   No murmur heard. Pulmonary/Chest: No respiratory distress. She has no wheezes. She has no rales. She exhibits no tenderness.  Abdominal: She exhibits no distension and no mass. There is no tenderness.  Musculoskeletal: She exhibits no edema and no tenderness.  Fingers are stiff with arthritis. Unable to fully close hands.  Lymphadenopathy:    She has no cervical adenopathy.  Neurological: She is alert. No cranial nerve deficit.  06/13/13 MMSE: 14/30. Failed clock drawing  Skin: Skin is dry. No rash noted. No erythema. No pallor.  Psychiatric: She has a normal mood and affect. Her behavior is normal. Thought content normal.      LAB REVIEW 06/07/13 CMP: normal  Lipids: TC  274, trig 67, HDL 112, LDL 149  TSH 3.204    Assessment & Plan:  Unspecified essential hypertension:  controlled  Alzheimer's disease; unchanged  Hyperlipidemia: stable. High HDL.  Unspecified constipation; improved  Unspecified hypothyroidism: corrected on levothyroxine.

## 2013-06-13 NOTE — Progress Notes (Signed)
Failed clock drawing  

## 2013-06-13 NOTE — Patient Instructions (Signed)
Continue current medication.

## 2013-07-07 ENCOUNTER — Other Ambulatory Visit: Payer: Self-pay

## 2013-07-19 ENCOUNTER — Other Ambulatory Visit: Payer: Self-pay | Admitting: Neurology

## 2013-08-19 ENCOUNTER — Other Ambulatory Visit: Payer: Self-pay | Admitting: Family Medicine

## 2013-08-19 ENCOUNTER — Other Ambulatory Visit: Payer: Self-pay | Admitting: Internal Medicine

## 2013-09-21 ENCOUNTER — Non-Acute Institutional Stay: Payer: Medicare Other | Admitting: Geriatric Medicine

## 2013-09-21 VITALS — BP 104/60 | HR 60 | Ht 66.0 in | Wt 125.0 lb

## 2013-09-21 DIAGNOSIS — G309 Alzheimer's disease, unspecified: Principal | ICD-10-CM

## 2013-09-21 DIAGNOSIS — J310 Chronic rhinitis: Secondary | ICD-10-CM

## 2013-09-21 DIAGNOSIS — E039 Hypothyroidism, unspecified: Secondary | ICD-10-CM

## 2013-09-21 DIAGNOSIS — E785 Hyperlipidemia, unspecified: Secondary | ICD-10-CM

## 2013-09-21 DIAGNOSIS — K5900 Constipation, unspecified: Secondary | ICD-10-CM

## 2013-09-21 DIAGNOSIS — F0280 Dementia in other diseases classified elsewhere without behavioral disturbance: Secondary | ICD-10-CM

## 2013-09-21 DIAGNOSIS — I10 Essential (primary) hypertension: Secondary | ICD-10-CM

## 2013-09-21 DIAGNOSIS — Z66 Do not resuscitate: Secondary | ICD-10-CM

## 2013-09-21 NOTE — Progress Notes (Signed)
Patient ID: Sonya Robertson, female   DOB: 07/03/1933, 80 y.o.   MRN: [id number]  [facility] [phone])  Code status: DNR  Contact Information   Name Relation Home Work Mobile   Schwalm,DAVID  [id number]        Chief Complaint  Patient presents with  . Medical Managment of Chronic Issues    Alzheimer's, blood pressure    HPI: This is a 80 y.o. female resident of [facility],  Independent Living    section evaluated today for management of ongoing medical issues.    Last visit:  01/2013:   Alzheimer's disease Functional status with minimal decline- spouse supervises showering, all other ADLS independent. . Language skills nearly intact, some difficulty word finding. No behavioral issues; no wandering. Will start participation in [employer] next month, family has discussed caregiver assistance at home if needed in the future. Repeat MMSE next visit w/ Dr.Green  Unspecified essential hypertension Amlodipine stopped 11/2012. BP satisfactory.  Since last visit patient does not have any acute medical issues the continues to reside in her independent living home at [facility] with her spouse who is her major source of support. There's been no significant change in her cognitive or functional status; she continues to be independent with ADLs, requires assistance with all IADLs. She continues to exercise regularly with a personal trainer 3 times a week as well as daily walks. She and her spouse continue to have some social interactions.   Allergies  Allergies  Allergen Reactions  . Codeine   . Demerol [Meperidine]   . Sulfa Antibiotics      Medication List       This list is accurate as of: 09/21/13 11:59 PM.  Always use your most recent med list.               aspirin 81 MG tablet  Take 81 mg by mouth daily.     donepezil 23 MG Tabs tablet  Commonly known as:  ARICEPT  Take 1 tablet (23 mg total) by mouth daily.     multivitamin tablet    Take 1 tablet by mouth daily.     NASCOBAL 500 MCG/0.1ML Soln  Generic drug:  Cyanocobalamin  USE 1 SPRAY INTRANASALLY ONCE A WEEK.     polyethylene glycol packet  Commonly known as:  MIRALAX / GLYCOLAX  Take 17 g by mouth. One packet in 4-8 oz liquid once a week for laxative     SYNTHROID 88 MCG tablet  Generic drug:  levothyroxine  TAKE (1) TABLET DAILY FOR THYROID.         DATA REVIEWED Laboratory Studies: Lab Results- [facility] 06/07/2013  Component Value   GLUCOSE 80       CHOL 274*   TRIG 67   HDL 112*   LDLCALC 149       ALT 15   AST 20   NA 141   K 3.9   CREATININE 0.7   BUN 21        REVIEW OF SYSTEMS DATA OBTAINED: from patient, nurse,  family member (spouse) GENERAL: Feels well   No fevers, fatigue, change in appetite or weight SKIN: No itch, rash or open wounds EYES: No eye pain, dryness or itching  No change in vision EARS: No earache, tinnitus, wears hearing aids NOSE: No congestion or bleeding.  Drippy nose MOUTH/THROAT: No mouth or tooth pain   No sore throat    No difficulty chewing or swallowing RESPIRATORY: No  cough, wheezing, SOB CARDIAC: No chest pain, palpitations  No edema. GI: No abdominal pain  No N/V/D  No heartburn or reflux  constipation managed with combination of Metamucil, water and diet GU: No dysuria, frequency or urgency  No change in urine volume or character  MUSCULOSKELETAL: No joint pain, swelling or stiffness  No back pain  No muscle ache, pain, weakness  Gait is steady  No recent falls.  NEUROLOGIC: No dizziness, fainting, headache,  No change in mental status (dementia).  PSYCHIATRIC: No feelings of anxiety, depression   Sleeps well.  No behavior issue.    PHYSICAL EXAM  Filed Vitals:   09/21/13 1531  BP: 104/60  Pulse: 60  Height: 5\' 6"  (1.676 m)  Weight: 125 lb (56.7 kg)   Body mass index is 20.19 kg/(m^2).  GENERAL APPEARANCE: No acute distress, appropriately groomed, thin body habitus. Alert, pleasant,  conversant. Exhibits some difficulty with word finding SKIN: No diaphoresis, rash, unusual lesions, wounds HEAD: Normocephalic, atraumatic EYES: Conjunctiva/lids clear. Pupils round, reactive. Marland Kitchen  EARS: External exam WNL, Aide in each ear   Hearing grossly normal. NOSE: No deformity or discharge. MOUTH/THROAT: Lips w/o lesions. Oral mucosa, tongue moist, w/o lesion. Oropharynx w/o redness or lesions.  NECK: Supple, full ROM. No thyroid tenderness, enlargement or nodule LYMPHATICS: No head, neck or supraclavicular adenopathy RESPIRATORY: Breathing is even, unlabored. Lung sounds are clear and full.  CARDIOVASCULAR: Heart RRR. No murmur or extra heart sounds  EDEMA: No peripheral edema.  GASTROINTESTINAL: Abdomen is soft, non-tender, not distended w/ normal bowel sounds.  MUSCULOSKELETAL: Moves all extremities with full ROM, strength and tone.Scoliosis present. Gait is steady NEUROLOGIC: Oriented to time, place, person. Speech clear, somewhat stilted, no tremor. PSYCHIATRIC: Mood and affect appropriate to situation   ASSESSMENT/PLAN  .Unspecified essential hypertension Blood pressure remains well-controlled off medication  Alzheimer's disease Functional status appears unchanged, mild decrease in cognition is evident by language deficit. No home management issues identified. Patient's appetite and weight are stable. Continue current medication, repeat MMSE at next visit  Rhinitis, chronic Patient's only complaint today is chronic drippy nose. Unclear if this is related to allergic or atrophic rhinitis. Will try  Antihistamine, if no improvement after 1 week instructed spouse to stop the medication  Hyperlipidemia Most recent lipid levels are satisfactory, no need for medication.  Unspecified hypothyroidism Most recent TSH within normal limits, patient appears euthyroid. Continue current supplement dose.  Unspecified constipation Constipation is being managed with combination of  Metamucil and water in diet. Have reinforced the need for adequate water intake when using Metamucil. Also suggest adding prune juice to diet.   Follow up:  3 months Dr. Nyoka Cowden, MMSE/Clock test, 6 month CTK, EV  Raelynn Corron T.Kamaal Cast, NP-C 09/21/2013

## 2013-09-22 ENCOUNTER — Encounter: Payer: Self-pay | Admitting: Geriatric Medicine

## 2013-09-22 DIAGNOSIS — J310 Chronic rhinitis: Secondary | ICD-10-CM | POA: Insufficient documentation

## 2013-09-22 MED ORDER — LORATADINE 10 MG PO TABS: 10.0000 mg | ORAL_TABLET | Freq: Every day | ORAL | Status: DC

## 2013-09-22 NOTE — Assessment & Plan Note (Signed)
Constipation is being managed with combination of Metamucil and water in diet. Have reinforced the need for adequate water intake when using Metamucil. Also suggest adding prune juice to diet.

## 2013-09-22 NOTE — Assessment & Plan Note (Signed)
Blood pressure remains well-controlled off medication

## 2013-09-22 NOTE — Assessment & Plan Note (Signed)
Patient's only complaint today is chronic drippy nose. Unclear if this is related to allergic or atrophic rhinitis. Will try  Antihistamine, if no improvement after 1 week instructed spouse to stop the medication

## 2013-09-22 NOTE — Assessment & Plan Note (Signed)
Most recent TSH within normal limits, patient appears euthyroid. Continue current supplement dose.

## 2013-09-22 NOTE — Assessment & Plan Note (Signed)
Functional status appears unchanged, mild decrease in cognition is evident by language deficit. No home management issues identified. Patient's appetite and weight are stable. Continue current medication, repeat MMSE at next visit

## 2013-09-22 NOTE — Assessment & Plan Note (Signed)
Most recent lipid levels are satisfactory, no need for medication.

## 2013-10-14 ENCOUNTER — Other Ambulatory Visit: Payer: Self-pay | Admitting: Neurology

## 2013-10-19 ENCOUNTER — Encounter: Payer: Self-pay | Admitting: Neurology

## 2013-10-19 ENCOUNTER — Ambulatory Visit (INDEPENDENT_AMBULATORY_CARE_PROVIDER_SITE_OTHER): Payer: Medicare Other | Admitting: Neurology

## 2013-10-19 VITALS — BP 130/70 | HR 72 | Resp 18 | Wt 126.0 lb

## 2013-10-19 DIAGNOSIS — F0391 Unspecified dementia with behavioral disturbance: Secondary | ICD-10-CM

## 2013-10-19 DIAGNOSIS — F03918 Unspecified dementia, unspecified severity, with other behavioral disturbance: Secondary | ICD-10-CM

## 2013-10-19 MED ORDER — MEMANTINE HCL ER 7 & 14 & 21 &28 MG PO CP24: 28.0000 mg | ORAL_CAPSULE | Freq: Once | ORAL | Status: DC

## 2013-10-19 MED ORDER — MEMANTINE HCL ER 28 MG PO CP24: 28.0000 mg | ORAL_CAPSULE | Freq: Once | ORAL | Status: DC

## 2013-10-19 NOTE — Patient Instructions (Signed)
Memantine Tablets  What is this medicine?  MEMANTINE (MEM an teen) is used to treat dementia caused by Alzheimer's disease.  This medicine may be used for other purposes; ask your health care provider or pharmacist if you have questions.  COMMON BRAND NAME(S): Namenda  What should I tell my health care provider before I take this medicine?  They need to know if you have any of these conditions:  -difficulty passing urine  -kidney disease  -liver disease  -seizures  -an unusual or allergic reaction to memantine, other medicines, foods, dyes, or preservatives  -pregnant or trying to get pregnant  -breast-feeding  How should I use this medicine?  Take this medicine by mouth with a glass of water. Follow the directions on the prescription label. You may take this medicine with or without food. Take your doses at regular intervals. Do not take your medicine more often than directed. Continue to take your medicine even if you feel better. Do not stop taking except on the advice of your doctor or health care professional.  Talk to your pediatrician regarding the use of this medicine in children. Special care may be needed.  Overdosage: If you think you have taken too much of this medicine contact a poison control center or emergency room at once.  NOTE: This medicine is only for you. Do not share this medicine with others.  What if I miss a dose?  If you miss a dose, take it as soon as you can. If it is almost time for your next dose, take only that dose. Do not take double or extra doses. If you do not take your medicine for several days, contact your health care provider. Your dose may need to be changed.  What may interact with this medicine?  -acetazolamide  -amantadine  -cimetidine  -dextromethorphan  -dofetilide  -hydrochlorothiazide  -ketamine  -metformin  -methazolamide  -quinidine  -ranitidine  -sodium bicarbonate  -triamterene  This list may not describe all possible interactions. Give your health care provider a  list of all the medicines, herbs, non-prescription drugs, or dietary supplements you use. Also tell them if you smoke, drink alcohol, or use illegal drugs. Some items may interact with your medicine.  What should I watch for while using this medicine?  Visit your doctor or health care professional for regular checks on your progress. Check with your doctor or health care professional if there is no improvement in your symptoms or if they get worse.  You may get drowsy or dizzy. Do not drive, use machinery, or do anything that needs mental alertness until you know how this drug affects you. Do not stand or sit up quickly, especially if you are an older patient. This reduces the risk of dizzy or fainting spells. Alcohol can make you more drowsy and dizzy. Avoid alcoholic drinks.  What side effects may I notice from receiving this medicine?  Side effects that you should report to your doctor or health care professional as soon as possible:  -allergic reactions like skin rash, itching or hives, swelling of the face, lips, or tongue  -agitation or a feeling of restlessness  -depressed mood  -dizziness  -hallucinations  -redness, blistering, peeling or loosening of the skin, including inside the mouth  -seizures  -vomiting  Side effects that usually do not require medical attention (report to your doctor or health care professional if they continue or are bothersome):  -constipation  -diarrhea  -headache  -nausea  -trouble sleeping  This   F). Throw away any unused medicine after the expiration date. NOTE: This sheet is a summary. It may not cover all possible information. If you have questions about this  medicine, talk to your doctor, pharmacist, or health care provider.  2014, [employer]. (2013-06-06 14:10:42)

## 2013-10-19 NOTE — Progress Notes (Signed)
Reason for visit : A follow up for memory loss;   Mrs. Patin is an established patient of our practice, has been followed for memory loss. The patient undergoes serial Mini-Mental Status Examination upon visits and seems to have more difficulties to oriented to time and place, date- score today 11/30 points and generated 5 words on animal fluency. She was not able to copy an image. The geriatric depression scale was endorsed at 0 points.    She also has more trouble generating words and has actually cut becomes quite sparse with verbal output.      HPI: Ms. Claw returns for followup her daughter and her husband. She was last seen by Dr. Brett Fairy 07/23/2012.  04/13/13:  Patient  returns for MMSE now at 12 -30. GDS=0. She is living at [facility].  No longer cooking for themselves , has personal trainer 3 times a week and goes to adult center for enrichment 2 times a week. Aricept was increased to 23 mg at last visit and memory is stable according to daughter and husband  She has problems expressing herself and sometimes seems to have difficulties understanding, too. She loses objects, misplacing things in the house, word finding difficulties have made it harder to complete a sentence  and sometimes she forgets what she intended to say at all. 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 She has two Master's degrees.  Patient is retired, as is her husband.  She has routines for meals, bedtimes. She doesn't forget meals,  likes to travel. Her husband reports her being socially less interactive and no longer involved in her favorite clubs,  less cooking, less independent  due to back pain, hearing loss, neuropathy were all blamed, but he feels these last two years have shown dementia.  She gave the checkbook balancing to her husband, uses the computer much less.  She is afraid of making a mistake.  Her daughter noted that her mother appears  anxious, tense, worried, and her physical conditions seem not to be contributing to this. Anxious, she misses cues, for ex. searching around for the  the handbag right in front of her.  MRI of the brain with mild atrophy and SVD. EEG with slowing.    Also with  B 12 deficiency and takes the nasal spray.  ROS:  Weight loss, ringing in the ears, allergies, anxiety, memory loss   Medications Current Outpatient Prescriptions on File Prior to Visit  Medication Sig Dispense Refill  . aspirin 81 MG tablet Take 81 mg by mouth daily.      Marland Kitchen donepezil (ARICEPT) 23 MG TABS tablet Take 1 tablet (23 mg total) by mouth daily.  90 tablet  1  . Multiple Vitamin (MULTIVITAMIN) tablet Take 1 tablet by mouth daily.      Marland Kitchen NASCOBAL 500 MCG/0.1ML SOLN USE 1 SPRAY INTRANASALLY ONCE A WEEK.  3.9 mL  4  . polyethylene glycol (MIRALAX / GLYCOLAX) packet Take 17 g by mouth. One packet in 4-8 oz liquid once a week for laxative      . SYNTHROID 88 MCG tablet TAKE (1) TABLET DAILY FOR THYROID.  30 tablet  3   No current facility-administered medications on file prior to visit.    Allergies  Allergies  Allergen Reactions  . Codeine   . Demerol [Meperidine]   . Sulfa Antibiotics     Physical Exam General: well developed, frail female  seated, in no evident  distress, the patient is slender , well groomed and friendly.  Head: head normocephalic and atraumatic. Oropharynx benign Neck: supple with no carotid  bruits Cardiovascular: regular rate and rhythm, no murmurs  Neurologic Exam Mental Status: Awake and  alert. MMSE 12/30 missing items in orientation, calculation, recall and unable to copy a figure . Follows all commands. Speech and language normal  - she has hearing aids.  Cranial Nerves: Pupils equal, briskly reactive to light. Extraocular movements full without nystagmus. Visual fields full to confrontation.  Right ptosis pre-existing,  Hearing intact with aids , Tinnitus,  and symmetric to finger snap.   Facial sensation intact. Face, tongue, palate move normally and symmetrically. Neck flexion and extension normal.  Motor: Normal bulk and tone. Normal strength in all tested extremity muscles.No focal weakness Sensory.: intact to touch and pinprick and vibratory. The patient shows no signs of extinction or neglect.  She did not verbally name the left or right side in orienting questions , but pointed to the side affected by fine touch testing.  Coordination: Rapid alternating movements normal in all extremities. Finger-to-nose and heel-to-shin performed accurately bilaterally. No dysmetria. Mild resting resting tremor.  Gait and Station: Arises from chair without difficulty. Stance is normal based , Gait demonstrates normal stride length and balance . She is able to perform a tandem gait and stands stable for Romberg. ,  No assistive device needed, .  Reflexes: 2+ and symmetric. Toes downgoing.     ASSESSMENT: Dementia currently on Aricept 23 mg daily without side effects. Denies hallucinations    PLAN: Pt will continue Aricept at current dose, will refill Continue exercise for overall good health F/U in 6 months, next visit with NP Hassell Done.  Kyleen Villatoro, MD

## 2013-12-12 ENCOUNTER — Non-Acute Institutional Stay: Payer: Medicare Other | Admitting: Internal Medicine

## 2013-12-12 ENCOUNTER — Encounter: Payer: Self-pay | Admitting: Internal Medicine

## 2013-12-12 VITALS — BP 124/70 | HR 60 | Wt 122.0 lb

## 2013-12-12 DIAGNOSIS — I10 Essential (primary) hypertension: Secondary | ICD-10-CM

## 2013-12-12 DIAGNOSIS — F0280 Dementia in other diseases classified elsewhere without behavioral disturbance: Secondary | ICD-10-CM

## 2013-12-12 DIAGNOSIS — G309 Alzheimer's disease, unspecified: Principal | ICD-10-CM

## 2013-12-12 DIAGNOSIS — E785 Hyperlipidemia, unspecified: Secondary | ICD-10-CM

## 2013-12-12 DIAGNOSIS — E039 Hypothyroidism, unspecified: Secondary | ICD-10-CM

## 2013-12-12 NOTE — Progress Notes (Signed)
Patient ID: Sonya Robertson, female   DOB: 1933-03-04, 80 y.o.   MRN: [id number]    Location:  [facility]   Place of Service: OFFICE    Allergies  Allergen Reactions  . Codeine   . Demerol [Meperidine]   . Sulfa Antibiotics     Chief Complaint  Patient presents with  . Medical Managment of Chronic Issues    blood pressure, thyroid, anxiety, Alzheimer's    HPI:  Alzheimer's disease: slowly progressing  Unspecified essential hypertension: controlled  Hyperlipidemia: recheck next visit  Unspecified hypothyroidism: recheck next visit    Medications: Patient's Medications  New Prescriptions   No medications on file  Previous Medications   ASPIRIN 81 MG TABLET    Take 81 mg by mouth daily.   DONEPEZIL (ARICEPT) 23 MG TABS TABLET    Take 1 tablet (23 mg total) by mouth daily.   MEMANTINE HCL ER (NAMENDA XR) 28 MG CP24    Take 28 mg by mouth once.   NASCOBAL 500 MCG/0.1ML SOLN    USE 1 SPRAY INTRANASALLY ONCE A WEEK.   PSYLLIUM (METAMUCIL) 58.6 % POWDER    Take 1 packet by mouth daily.   SYNTHROID 88 MCG TABLET    TAKE (1) TABLET DAILY FOR THYROID.  Modified Medications   No medications on file  Discontinued Medications   MEMANTINE HCL ER 7 & 14 & 21 &28 MG CP24    Take 28 mg by mouth once.   MULTIPLE VITAMIN (MULTIVITAMIN) TABLET    Take 1 tablet by mouth daily.   POLYETHYLENE GLYCOL (MIRALAX / GLYCOLAX) PACKET    Take 17 g by mouth. One packet in 4-8 oz liquid once a week for laxative     Review of Systems  Constitutional: Negative for fever, chills, diaphoresis, activity change, appetite change, fatigue and unexpected weight change.  HENT: Negative for congestion, ear discharge, ear pain, hearing loss, mouth sores and nosebleeds.   Eyes: Positive for visual disturbance.  Respiratory: Negative for apnea, choking and wheezing.   Cardiovascular: Negative for chest pain, palpitations and leg swelling.  Gastrointestinal: Negative for abdominal pain and abdominal distention.    Endocrine: Negative.   Genitourinary: Negative.   Musculoskeletal: Positive for arthralgias and back pain. Negative for gait problem, myalgias, neck pain and neck stiffness.  Skin: Negative for color change.  Neurological:       Memory deficit. Absent vibratory sense in toes, but positive at ankles.  Psychiatric/Behavioral: Negative.     Filed Vitals:   12/12/13 1550  BP: 124/70  Pulse: 60  Weight: 122 lb (55.339 kg)   Physical Exam  Constitutional: She appears well-developed and well-nourished. No distress.  Thin.  HENT:  Head: Normocephalic and atraumatic.  Right Ear: External ear normal.  Left Ear: External ear normal.  Nose: Nose normal.  Mouth/Throat: Oropharynx is clear and moist.  Eyes: Conjunctivae and EOM are normal. Pupils are equal, round, and reactive to light.  Corrective  Lenses.  Neck: Neck supple. No JVD present. No tracheal deviation present. No thyromegaly present.  Cardiovascular: Normal rate, regular rhythm and normal heart sounds.  Exam reveals no gallop and no friction rub.   No murmur heard. Pulmonary/Chest: No respiratory distress. She has no wheezes. She has no rales. She exhibits no tenderness.  Abdominal: She exhibits no distension and no mass. There is no tenderness.  Musculoskeletal: She exhibits no edema and no tenderness.  Fingers are stiff with arthritis. Unable to fully close hands.  Lymphadenopathy:  She has no cervical adenopathy.  Neurological: She is alert. No cranial nerve deficit.  06/13/13 MMSE: 14/30. Failed clock drawing  Skin: Skin is dry. No rash noted. No erythema. No pallor.  Psychiatric: She has a normal mood and affect. Her behavior is normal. Thought content normal.     Labs reviewed: Nursing Home on 09/21/2013  Component Date Value Ref Range Status  . Glucose 06/07/2013 80   Final  . Potassium 06/07/2013 3.9  3.4 - 5.3 mmol/L Final  . Sodium 06/07/2013 141  137 - 147 mmol/L Final  . LDl/HDL Ratio 06/07/2013 2.4    Final  . Cholesterol 06/07/2013 274* 0 - 200 mg/dL Final  . LDL Cholesterol 06/07/2013 149   Final  . Alkaline Phosphatase 06/07/2013 36  25 - 125 U/L Final  . ALT 06/07/2013 15  7 - 35 U/L Final  . AST 06/07/2013 20  13 - 35 U/L Final  . TSH 06/07/2013 3.20  0.41 - 5.90 uIU/mL Final  . BUN 06/07/2013 21  4 - 21 mg/dL Final  . Creatinine 06/07/2013 0.7  0.5 - 1.1 mg/dL Final  . Triglycerides 06/07/2013 67  40 - 160 mg/dL Final  . HDL 06/07/2013 112* 35 - 70 mg/dL Final      Assessment/Plan 1. Alzheimer's disease Continue current medications  2. Unspecified essential hypertension controlled  3. Hyperlipidemia Recheck next visit  4. Unspecified hypothyroidism Recheck next visit

## 2013-12-23 ENCOUNTER — Other Ambulatory Visit: Payer: Self-pay | Admitting: Internal Medicine

## 2014-01-01 LAB — BASIC METABOLIC PANEL
Creatinine: 0.7 mg/dL (ref 0.5–1.1)
Glucose: 113 mg/dL
Potassium: 3.9 mmol/L (ref 3.4–5.3)
Sodium: 137 mmol/L (ref 137–147)

## 2014-01-01 LAB — CBC AND DIFFERENTIAL
HCT: 40 % (ref 36–46)
Hemoglobin: 13.1 g/dL (ref 12.0–16.0)
PLATELETS: 158 10*3/uL (ref 150–399)
WBC: 9.7 10*3/mL

## 2014-01-04 ENCOUNTER — Encounter: Payer: Self-pay | Admitting: Geriatric Medicine

## 2014-01-04 ENCOUNTER — Non-Acute Institutional Stay: Payer: Medicare Other | Admitting: Geriatric Medicine

## 2014-01-04 VITALS — BP 100/62 | HR 92 | Temp 97.8°F | Wt 121.0 lb

## 2014-01-04 DIAGNOSIS — K5900 Constipation, unspecified: Secondary | ICD-10-CM

## 2014-01-04 DIAGNOSIS — N390 Urinary tract infection, site not specified: Secondary | ICD-10-CM

## 2014-01-04 MED ORDER — SENNA 8.6 MG PO TABS: 1.0000 | ORAL_TABLET | Freq: Every day | ORAL | Status: DC

## 2014-01-04 MED ORDER — CIPROFLOXACIN HCL 250 MG PO TABS: 250.0000 mg | ORAL_TABLET | Freq: Two times a day (BID) | ORAL | Status: DC

## 2014-01-04 NOTE — Progress Notes (Signed)
Patient ID: Sonya Robertson, female   DOB: 10/13/1932, 80 y.o.   MRN: [id number]   [facility] [phone]   Contact Information   Name Relation Home Work Maurice  [phone]         Chief Complaint  Patient presents with  . ER follow-up    on 01/01/14 went to [facility] ER for acuute cystitis without hematuria.  With husband today    HPI: This is a 80 y.o. female resident of [place], Independent Living  section.  Evaluation is requested today in follow up of ED visit in [place], [place] 12/31/13. Telemetry was current concern because patient appeared weaker, had progressively decreased p.o. Intake. ED evaluation included laboratory studies and urinalysis. Patient was given some IV fluids and prescribed cephalexin t.i.d. for 7 days for presumed UTI. Spouse reports patient is moving around a bit better, p.o. intake is fair, having some difficulty getting her to drink much fluids. She did complain of lower abdominal pain the night before last.    Allergies  Allergen Reactions  . Codeine   . Demerol [Meperidine]   . Sulfa Antibiotics     MEDICATIONS -     Medication List       This list is accurate as of: 01/04/14 10:17 AM.  Always use your most recent med list.               aspirin 81 MG tablet  Take 81 mg by mouth daily.     ciprofloxacin 250 MG tablet  Commonly known as:  CIPRO  Take 1 tablet (250 mg total) by mouth 2 (two) times daily.     donepezil 23 MG Tabs tablet  Commonly known as:  ARICEPT  Take 1 tablet (23 mg total) by mouth daily.     Memantine HCl ER 28 MG Cp24  Commonly known as:  NAMENDA XR  Take 28 mg by mouth once.     NASCOBAL 500 MCG/0.1ML Soln  Generic drug:  Cyanocobalamin  USE 1 SPRAY INTRANASALLY ONCE A WEEK.     senna 8.6 MG Tabs tablet  Commonly known as:  SENOKOT  Take 1 tablet (8.6 mg total) by mouth at bedtime.     SYNTHROID 88 MCG tablet  Generic drug:  levothyroxine  TAKE (1)  TABLET DAILY FOR THYROID.         DATA REVIEWED  Radiologic Exams:   Laboratory Studies:  Lab Results  Component Value Date   NA 141 06/07/2013   K 3.9 06/07/2013   GLU 80 06/07/2013   BUN 21 06/07/2013   CREATININE 0.7 06/07/2013   Lab Results  Component Value Date   WBC 9.7 01/01/2014   HGB 13.1 01/01/2014   HCT 40 01/01/2014   PLT 158 01/01/2014    Lab Results  Component Value Date   NA 137 01/01/2014   K 3.9 01/01/2014   GLU 113 01/01/2014   CREATININE 0.7 01/01/2014   eGFR  76  01/01/2014: Urine culture: Greater than 100,000 colonies Escherichia coli  intermediate susceptibility to cefazolin, sensitive to Cipro    REVIEW OF SYSTEMS  DATA OBTAINED: from patient,  family member(spouse) GENERAL: Feels "OK"  No fatigue, change in appetite, or weight .  Continues to work with a exercise trainer 3 times a week. Spouse reports he will be requiring home care assistance for hygiene bathing starting tomorrow. RESPIRATORY: No cough, wheezing, SOB CARDIAC: No chest pain, palpitations. No edema GI: No abdominal pain  No Nausea,vomiting,diarrhea or constipation  No heartburn or reflux  GU: Patient denies dysuria, frequency or incontinence. Did complain of lower, pain the night before last MUSCULOSKELETAL: No joint pain, swelling or stiffness   No back pain    No muscle ache, pain, weakness    Gait is steady    No recent falls  NEUROLOGIC: No dizziness, fainting, headache, No change in mental status (dementia, significant short-term memory loss) PSYCHIATRIC: No reported increased anxiety, depression  Sleeps well   No behavior issue   PHYSICAL EXAM Filed Vitals:   01/04/14 0946  BP: 100/62  Pulse: 92  Temp: 97.8 F (36.6 C)  TempSrc: Oral  Weight: 121 lb (54.885 kg)   Body mass index is 19.54 kg/(m^2).  GENERAL APPEARANCE: No acute distress, appropriately groomed, normal body habitus Alert, pleasant, conversant. SKIN: No diaphoresis, rash, wound HEAD: Normocephalic, atraumatic EYES:  Conjunctiva/lids clear  RESPIRATORY: Breathing is even, unlabored  Lung sounds are clear and full  CARDIOVASCULAR: Heart RRR   No murmur or extra heart sounds   EDEMA: No peripheral edema   GASTROINTESTINAL: Abdomen is soft, non-tender, not distended w/ normal bowel sounds. No hepatic or splenic enlargement. No mass, ventral or inguinal hernia. GENITOURINARY: Bladder non tender, not distended. MUSCULOSKELETAL.  Significant kyphosis and scoliosis Gait is slow, steady PSYCHIATRIC: Mood and affect appropriate to situation    ASSESSMENT/PLAN  Constipation Spouse reports he has difficulty having patient drink enough water. I recommended stopping Metamucil and using Senokot-S instead at night.  UTI (urinary tract infection) UTI diagnosed 3 days ago, empiric cephalexin not best choice for this infection. Will change to Cipro. Patient's symptoms were significant weakness and progressively decreased p.o. Intake. Spouse has noted patient requires increased assistance with hygiene, he has arranged for home care assistance.    Family/ staff Communication:  Discussed with souse reasons for change of antibiotic. Encouraged home care assistance; as this patient's dementia progresses she will require more help with daily activities.    Labs/tests ordered:    Follow up: Return if symptoms worsen or fail to improve, for As scheduled.   Mardene Celeste, NP-C [place] [phone]  01/04/2014

## 2014-01-04 NOTE — Assessment & Plan Note (Signed)
UTI diagnosed 3 days ago, empiric cephalexin not best choice for this infection. Will change to Cipro. Patient's symptoms were significant weakness and progressively decreased p.o. Intake. Spouse has noted patient requires increased assistance with hygiene, he has arranged for home care assistance.

## 2014-01-04 NOTE — Assessment & Plan Note (Signed)
Spouse reports he has difficulty having patient drink enough water. I recommended stopping Metamucil and using Senokot-S instead at night.

## 2014-01-23 ENCOUNTER — Other Ambulatory Visit: Payer: Self-pay | Admitting: Neurology

## 2014-03-14 LAB — LIPID PANEL
Cholesterol: 255 mg/dL — AB (ref 0–200)
HDL: 109 mg/dL — AB (ref 35–70)
LDL Cholesterol: 137 mg/dL
LDL/HDL RATIO: 1.3
TRIGLYCERIDES: 49 mg/dL (ref 40–160)

## 2014-03-14 LAB — BASIC METABOLIC PANEL
BUN: 21 mg/dL (ref 4–21)
Creatinine: 0.8 mg/dL (ref 0.5–1.1)
Glucose: 85 mg/dL
POTASSIUM: 4.3 mmol/L (ref 3.4–5.3)
Sodium: 144 mmol/L (ref 137–147)

## 2014-03-14 LAB — HEPATIC FUNCTION PANEL
ALT: 25 U/L (ref 7–35)
AST: 20 U/L (ref 13–35)
Alkaline Phosphatase: 61 U/L (ref 25–125)
Bilirubin, Total: 0.6 mg/dL

## 2014-03-14 LAB — TSH: TSH: 3.1 u[IU]/mL (ref 0.41–5.90)

## 2014-03-22 ENCOUNTER — Encounter: Payer: Self-pay | Admitting: Geriatric Medicine

## 2014-03-22 ENCOUNTER — Non-Acute Institutional Stay: Payer: Medicare Other | Admitting: Nurse Practitioner

## 2014-03-22 ENCOUNTER — Encounter: Payer: Self-pay | Admitting: Nurse Practitioner

## 2014-03-22 VITALS — BP 120/72 | HR 60 | Temp 97.0°F | Ht 65.5 in | Wt 126.0 lb

## 2014-03-22 DIAGNOSIS — G309 Alzheimer's disease, unspecified: Secondary | ICD-10-CM

## 2014-03-22 DIAGNOSIS — F0280 Dementia in other diseases classified elsewhere without behavioral disturbance: Secondary | ICD-10-CM

## 2014-03-22 DIAGNOSIS — I10 Essential (primary) hypertension: Secondary | ICD-10-CM

## 2014-03-22 DIAGNOSIS — E785 Hyperlipidemia, unspecified: Secondary | ICD-10-CM

## 2014-03-22 DIAGNOSIS — E538 Deficiency of other specified B group vitamins: Secondary | ICD-10-CM

## 2014-03-22 DIAGNOSIS — K5900 Constipation, unspecified: Secondary | ICD-10-CM

## 2014-03-22 DIAGNOSIS — J310 Chronic rhinitis: Secondary | ICD-10-CM

## 2014-03-22 DIAGNOSIS — E039 Hypothyroidism, unspecified: Secondary | ICD-10-CM

## 2014-03-22 NOTE — Progress Notes (Signed)
Failed clock drawing  

## 2014-03-22 NOTE — Progress Notes (Signed)
Patient ID: Sonya Robertson, female   DOB: 11/19/1932, 80 y.o.   MRN: [id number]   Patient ID: Sonya LOUISSAINT, female   DOB: [birth year], 80 y.o.   MRN: [id number]  [facility] [phone])  Code status: DNR      Contact Information   Name Relation Home Work Mobile   Robertson,Sonya  [phone]        Chief Complaint  Patient presents with  . Medical Management of Chronic Issues    Comprehensive Exam: Alzheimer's, thyroid, cholesterol, blood pressure, anxiety, here with husband    HPI: This is a 80 y.o. female resident of [place],  Independent Living    section evaluated today for management of ongoing medical issues and annual exam.   In the last year husband reports the only major issues was UTI which they have been very good about making sure she does not get another one.  Gaining weight, eating well No changes in behavior  Functional status with on-going decline, now has help that comes in to help her bath and dress Pt is able to feed self and continent of bowel and bladder conts with personal trainer three days a week  Spouses only concern is that b12 is being administered appropriately due to change in the bottle he is unable to tell if she is actually getting this   . Allergies  Allergies  Allergen Reactions  . Codeine   . Demerol [Meperidine]   . Sulfa Antibiotics      Medication List       This list is accurate as of: 03/22/14 10:38 AM.  Always use your most recent med list.               aspirin 81 MG tablet  Take 81 mg by mouth daily.     donepezil 23 MG Tabs tablet  Commonly known as:  ARICEPT  TAKE 1 TABLET ONCE DAILY FOR MEMORY.     Memantine HCl ER 28 MG Cp24  Commonly known as:  NAMENDA XR  Take 28 mg by mouth once.     NASCOBAL 500 MCG/0.1ML Soln  Generic drug:  Cyanocobalamin  USE 1 SPRAY INTRANASALLY ONCE A WEEK.     polyethylene glycol packet  Commonly known as:  MIRALAX / GLYCOLAX  Take 17 g by  mouth. Take one pack as needed for constipation     SYNTHROID 88 MCG tablet  Generic drug:  levothyroxine  TAKE (1) TABLET DAILY FOR THYROID.         DATA REVIEWED Laboratory Studies: Lab Results- [facility] 06/07/2013  Component Value   GLUCOSE 80       CHOL 274*   TRIG 67   HDL 112*   LDLCALC 149       ALT 15   AST 20   NA 141   K 3.9   CREATININE 0.7   BUN 21        REVIEW OF SYSTEMS DATA OBTAINED: from patient, nurse,  family member (spouse) GENERAL: Feels well   No fevers, fatigue, change in appetite or weight SKIN: No itch, rash or open wounds EYES: No eye pain, dryness or itching  No change in vision EARS: No earache, tinnitus, wears hearing aids NOSE: No congestion or bleeding. MOUTH/THROAT: No mouth or tooth pain   No sore throat    No difficulty chewing or swallowing RESPIRATORY: No cough, wheezing, SOB CARDIAC: No chest pain, palpitations  No edema. GI: No abdominal pain  No  N/V/D  No heartburn or reflux  constipation managed  GU: No dysuria, frequency or urgency  No change in urine volume or character  MUSCULOSKELETAL: No joint pain, swelling or stiffness  No back pain  No muscle ache, pain, weakness. Gait is unsteady, uses husband for support, No recent falls.  NEUROLOGIC: No dizziness, fainting, headache,  No change in mental status (advaced dementia).  PSYCHIATRIC: No feelings of anxiety, depression   Sleeps well.  No behavior issue.    PHYSICAL EXAM  Filed Vitals:   03/22/14 1015  BP: 120/72  Pulse: 60  Temp: 97 F (36.1 C)  TempSrc: Oral  Height: 5' 5.5" (1.664 m)  Weight: 126 lb (57.153 kg)  SpO2: 95%   Body mass index is 20.64 kg/(m^2).  GENERAL APPEARANCE: No acute distress, appropriately groomed, thin body habitus. Alert, pleasant, quiet, husband answers most questions SKIN: No diaphoresis, rash, unusual lesions, wounds HEAD: Normocephalic, atraumatic EYES: Conjunctiva/lids clear. Pupils round, reactive. Marland Kitchen  EARS: External exam WNL,  Aide in each ear   Hearing grossly normal. NOSE: No deformity or clear discharge. MOUTH/THROAT: Lips w/o lesions. Oral mucosa, tongue moist, w/o lesion. Oropharynx w/o redness or lesions.  NECK: Supple, full ROM. No thyroid tenderness, enlargement or nodule LYMPHATICS: No head, neck or supraclavicular adenopathy RESPIRATORY: Breathing is even, unlabored. Lung sounds are clear and full.  CARDIOVASCULAR: Heart RRR. No murmur or extra heart sounds  EDEMA: No peripheral edema.  GASTROINTESTINAL: Abdomen is soft, non-tender, not distended w/ normal bowel sounds.  MUSCULOSKELETAL: Moves all extremities with full ROM, strength and tone.Scoliosis present. Gait is unsteady, uses husband as support, no recent falls NEUROLOGIC: Oriented to time, place, person. Speech clear, somewhat stilted, no tremor. PSYCHIATRIC: Mood and affect appropriate to situation   ASSESSMENT/PLAN   Unspecified essential hypertension Blood pressure remains well-controlled off medication  Alzheimer's disease Functional status appears to have slowly declined now requiring home health aid MMSE of 8/30 indicating severe dementia.  No home management issues identified husband does well with providing care and activities for patient. Patient's appetite and weight are stable. Continue current medication.   Rhinitis, chronic Stable  Hyperlipidemia Most recent lipid levels are elevated discussed this with spouse due to age and dementia no medication will be added at this time, no need for further monitoring due to the fact we will not be intervening due to age and dementia.   Unspecified hypothyroidism Most recent TSH within normal limits, patient appears euthyroid. Continue current supplement dose.  Unspecified constipation Constipation is being well managed at this time  b12 deficiency  conts nascobal, b12 level at 1153   To keep appt with Dr Nyoka Cowden in October and follow up as needed before then.

## 2014-03-28 ENCOUNTER — Encounter: Payer: Self-pay | Admitting: Internal Medicine

## 2014-04-20 ENCOUNTER — Other Ambulatory Visit: Payer: Self-pay | Admitting: Internal Medicine

## 2014-05-02 ENCOUNTER — Ambulatory Visit (INDEPENDENT_AMBULATORY_CARE_PROVIDER_SITE_OTHER): Payer: Medicare Other | Admitting: Neurology

## 2014-05-02 ENCOUNTER — Encounter: Payer: Self-pay | Admitting: Neurology

## 2014-05-02 VITALS — BP 133/72 | HR 108 | Resp 16 | Wt 127.0 lb

## 2014-05-02 DIAGNOSIS — G309 Alzheimer's disease, unspecified: Principal | ICD-10-CM

## 2014-05-02 DIAGNOSIS — F0280 Dementia in other diseases classified elsewhere without behavioral disturbance: Secondary | ICD-10-CM

## 2014-05-02 NOTE — Progress Notes (Signed)
Reason for visit : A follow up for memory loss;   Mrs. Derenzo is an established patient of our practice, has been followed for memory loss. The patient undergoes serial Mini-Mental Status Examination upon visits and seems to have more difficulties to oriented to time and place, date- score today 11/30 points and generated 3 words on animal fluency. She was not able to copy an image. The geriatric depression scale was endorsed at 0 points. She also has more trouble generating words and has actually cut becomes quite sparse with verbal output. She had a PCP visit with labs on 03-14-14 , her lipids are high, all metabolic tests were normal. She remains on B 12  Nasal spray once a week.  She is taking Aricept and Namenda  XR at highest commercial dose without side effects.   In comparison to last visit , the patient scored 11 points ,  And 5  on the animal fluency test last year . She did not copy an image or write a phase. She was not oriented to place or time. The patient is here accompanied by her family , the family  ( husband and daughter ) indicated her sparse word flow, she is unaware or in denial of aphasia, hesitation of speech. Her husband denies finding her depressed and is not reporting hallucinations. The patients only concern is constipation.   Per patient's recollection ; she rises at about 7 AM, wakes up without alarm. Breakfast with cereal and coffee. She walks and runs errands, she works out with a trainer 4 days a week. [facility]. [name] is her assistant, 4 days a week for shower and laundry . Monday she has lunch and runs errands with a friend.       HPI: Ms. Pollitt returns for followup her daughter and her husband. She was last seen by Dr. Brett Fairy 07/23/2012. Last visit 04/13/13:  Patient  returns for MMSE now at 93 -44. GDS=0. She is living at [facility].  Not longer cooking for themselves , has personal trainer 3 times a week and goes to adult center for enrichment 2 times a  week. Aricept was increased to 23 mg at last visit and memory is stable according to daughter and husband  She has problems expressing herself and sometimes seems to have difficulties understanding, too. She loses objects, misplacing things in the house, word finding difficulties have made it harder to complete a sentence  and sometimes she forgets what she intended to say at all. 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 She has two Master's degrees.  Patient is retired, as is her husband.  She has routines for meals, bedtimes. She doesn't forget meals,  likes to travel. Her husband reports her being socially less interactive and no longer involved in her favorite clubs,  less cooking, less independent  due to back pain, hearing loss, neuropathy were all blamed, but he feels these last two years have shown dementia.  She gave the checkbook balancing to her husband, uses the computer much less.  She is afraid of making a mistake.  Her daughter noted that her mother appears anxious, tense, worried, and her physical conditions seem not to be contributing to this. Anxious, she misses cues, for ex. searching around for the  the handbag right in front of her.  MRI of the brain with mild atrophy and SVD. EEG with slowing.    Also with  B 12 deficiency  and takes the nasal spray.  ROS:  Weight loss, ringing in the ears, allergies, anxiety, memory loss   Medications Current Outpatient Prescriptions on File Prior to Visit  Medication Sig Dispense Refill  . aspirin 81 MG tablet Take 81 mg by mouth daily.      Marland Kitchen donepezil (ARICEPT) 23 MG TABS tablet TAKE 1 TABLET ONCE DAILY FOR MEMORY.  90 tablet  1  . Memantine HCl ER (NAMENDA XR) 28 MG CP24 Take 28 mg by mouth once.  30 capsule  5  . NASCOBAL 500 MCG/0.1ML SOLN USE 1 SPRAY INTRANASALLY ONCE A WEEK.  3.9 mL  4  . polyethylene glycol (MIRALAX / GLYCOLAX) packet Take 17 g by mouth. Take one pack as  needed for constipation      . SYNTHROID 88 MCG tablet TAKE (1) TABLET DAILY FOR THYROID.  30 tablet  1   No current facility-administered medications on file prior to visit.    Allergies  Allergies  Allergen Reactions  . Codeine   . Demerol [Meperidine]   . Sulfa Antibiotics     Physical Exam General: well developed, frail female  seated, in no evident distress, the patient is slender , well groomed and friendly.  Head: head normocephalic and atraumatic. Oropharynx benign Neck: supple with no carotid  bruits Cardiovascular: regular rate and rhythm, no murmurs  Neurologic Exam Mental Status: Awake and  alert. MMSE 12/30 missing items in orientation, calculation, recall and unable to copy a figure . Follows all commands. Speech and language normal  - she has hearing aids.  Cranial Nerves: Pupils equal, briskly reactive to light. Extraocular movements full without nystagmus. Visual fields full to confrontation.  Right ptosis pre-existing,  Hearing intact with aids , Tinnitus,  and symmetric to finger snap.  Facial sensation intact. Face, tongue, palate move normally and symmetrically. Neck flexion and extension normal.  Motor: Normal bulk and tone. Normal strength in all tested extremity muscles.No focal weakness Sensory.: intact to touch and pinprick and vibratory. The patient shows no signs of extinction or neglect.  She did not verbally name the left or right side in orienting questions , but pointed to the side affected by fine touch testing.  Coordination: Rapid alternating movements normal in all extremities. Finger-to-nose and heel-to-shin performed accurately bilaterally. No dysmetria. Mild resting resting tremor.  Gait and Station: Arises from chair without difficulty. Stance is normal based , Gait demonstrates normal stride length and balance . She is able to perform a tandem gait and stands stable for Romberg. ,  No assistive device needed, .  Reflexes: 2+ and symmetric. Toes  downgoing.     ASSESSMENT: Dementia currently on Aricept 23 mg daily without side effects. Namenda XR 28 mg.   Denies hallucinations    PLAN: Pt will continue medication at current dose, will refill Continue exercise for overall good health F/U in 6 months, next visit with NP Hassell Done.   Elise Gladden, MD

## 2014-05-20 ENCOUNTER — Other Ambulatory Visit: Payer: Self-pay | Admitting: Neurology

## 2014-06-12 ENCOUNTER — Encounter: Payer: Self-pay | Admitting: Internal Medicine

## 2014-06-12 ENCOUNTER — Non-Acute Institutional Stay: Payer: Medicare Other | Admitting: Internal Medicine

## 2014-06-12 VITALS — BP 110/72 | HR 84 | Temp 97.9°F | Resp 18 | Wt 127.2 lb

## 2014-06-12 DIAGNOSIS — G309 Alzheimer's disease, unspecified: Secondary | ICD-10-CM

## 2014-06-12 DIAGNOSIS — K5901 Slow transit constipation: Secondary | ICD-10-CM

## 2014-06-12 DIAGNOSIS — F0280 Dementia in other diseases classified elsewhere without behavioral disturbance: Secondary | ICD-10-CM

## 2014-06-12 DIAGNOSIS — I10 Essential (primary) hypertension: Secondary | ICD-10-CM

## 2014-06-12 DIAGNOSIS — E039 Hypothyroidism, unspecified: Secondary | ICD-10-CM

## 2014-06-12 DIAGNOSIS — E785 Hyperlipidemia, unspecified: Secondary | ICD-10-CM

## 2014-06-12 DIAGNOSIS — E538 Deficiency of other specified B group vitamins: Secondary | ICD-10-CM

## 2014-06-12 NOTE — Progress Notes (Signed)
Patient ID: Sonya Robertson, female   DOB: 26-Feb-1933, 80 y.o.   MRN: [id number]    [place] Room Number: independent  Place of Service: Clinic (12)    Allergies  Allergen Reactions  . Codeine   . Demerol [Meperidine]   . Sulfa Antibiotics     Chief Complaint  Patient presents with  . Medical Management of Chronic Issues    Blood pressure, anxiety, Alzheimer's disease, hypothyroidism    HPI:  Alzheimer's disease: Unchanged. Husband has hired someone to be with her so that he can get a break from time to time. Patient has anxiety when he is not around.  B12 deficiency: Using Nascobal  Slow transit constipation: Improved with MiraLax  Hyperlipidemia: Needs followup next visit  Hypothyroidism, unspecified hypothyroidism type: Needs followup next visit  Essential hypertension: Controlled    Medications: Patient's Medications  New Prescriptions   No medications on file  Previous Medications   ASPIRIN 81 MG TABLET    Take 81 mg by mouth daily.   DONEPEZIL (ARICEPT) 23 MG TABS TABLET    TAKE 1 TABLET ONCE DAILY FOR MEMORY.   NAMENDA XR 28 MG CP24    TAKE (1) CAPSULE DAILY.   POLYETHYLENE GLYCOL (MIRALAX / GLYCOLAX) PACKET    Take 17 g by mouth. Take one pack as needed for constipation   SYNTHROID 88 MCG TABLET    TAKE (1) TABLET DAILY FOR THYROID.  Modified Medications   Modified Medication Previous Medication   CYANOCOBALAMIN (NASCOBAL) 500 MCG/0.1ML SOLN NASCOBAL 500 MCG/0.1ML SOLN      USE 1 SPRAY INTRANASALLY every other week    USE 1 SPRAY INTRANASALLY ONCE A WEEK.  Discontinued Medications   No medications on file     Review of Systems  Constitutional: Negative for fever, chills, diaphoresis, activity change, appetite change, fatigue and unexpected weight change.  HENT: Negative for congestion, ear discharge, ear pain, hearing loss, mouth sores and nosebleeds.   Eyes: Positive for visual disturbance.  Respiratory:  Negative for apnea, choking and wheezing.   Cardiovascular: Negative for chest pain, palpitations and leg swelling.  Gastrointestinal: Negative for abdominal pain and abdominal distention.  Endocrine: Negative.   Genitourinary: Negative.   Musculoskeletal: Positive for arthralgias and back pain. Negative for gait problem, myalgias, neck pain and neck stiffness.  Skin: Negative for color change.  Neurological:       Memory deficit. Absent vibratory sense in toes, but positive at ankles.  Psychiatric/Behavioral: Negative.     Filed Vitals:   06/12/14 1658  BP: 110/72  Pulse: 84  Temp: 97.9 F (36.6 C)  Resp: 18  Weight: 127 lb 3.2 oz (57.698 kg)  SpO2: 96%   Body mass index is 20.84 kg/(m^2).  Physical Exam  Constitutional: She appears well-developed and well-nourished. No distress.  Thin.  HENT:  Head: Normocephalic and atraumatic.  Right Ear: External ear normal.  Left Ear: External ear normal.  Nose: Nose normal.  Mouth/Throat: Oropharynx is clear and moist.  Eyes: Conjunctivae and EOM are normal. Pupils are equal, round, and reactive to light.  Corrective  Lenses.  Neck: Neck supple. No JVD present. No tracheal deviation present. No thyromegaly present.  Cardiovascular: Normal rate, regular rhythm and normal heart sounds.  Exam reveals no gallop and no friction rub.   No murmur heard. Pulmonary/Chest: No respiratory distress. She has no wheezes. She has no rales. She exhibits no tenderness.  Abdominal: She exhibits no distension and no mass. There is  no tenderness.  Musculoskeletal: She exhibits no edema and no tenderness.  Fingers are stiff with arthritis. Unable to fully close hands.  Lymphadenopathy:    She has no cervical adenopathy.  Neurological: She is alert. No cranial nerve deficit.  06/13/13 MMSE: 14/30. Failed clock drawing  Skin: Skin is dry. No rash noted. No erythema. No pallor.  Psychiatric: She has a normal mood and affect. Her behavior is normal.  Thought content normal.     Labs reviewed: Nursing Home on 03/22/2014  Component Date Value Ref Range Status  . Glucose 03/14/2014 85   Final  . BUN 03/14/2014 21  4 - 21 mg/dL Final  . Creatinine 03/14/2014 0.8  0.5 - 1.1 mg/dL Final  . Potassium 03/14/2014 4.3  3.4 - 5.3 mmol/L Final  . Sodium 03/14/2014 144  137 - 147 mmol/L Final  . LDl/HDL Ratio 03/14/2014 1.3   Final  . Triglycerides 03/14/2014 49  40 - 160 mg/dL Final  . Cholesterol 03/14/2014 255* 0 - 200 mg/dL Final  . HDL 03/14/2014 109* 35 - 70 mg/dL Final  . LDL Cholesterol 03/14/2014 137   Final  . Alkaline Phosphatase 03/14/2014 61  25 - 125 U/L Final  . ALT 03/14/2014 25  7 - 35 U/L Final  . AST 03/14/2014 20  13 - 35 U/L Final  . Bilirubin, Total 03/14/2014 0.6   Final  . TSH 03/14/2014 3.10  0.41 - 5.90 uIU/mL Final     Assessment/Plan  1. Alzheimer's disease Unchanged  2. B12 deficiency Continue Nascobal  3. Slow transit constipation Continue MiraLax  4. Hyperlipidemia -Lipid panel, future  5. Hypothyroidism, unspecified hypothyroidism type -TSH, future  6. Essential hypertension -CMP, future

## 2014-06-19 ENCOUNTER — Other Ambulatory Visit: Payer: Self-pay | Admitting: Internal Medicine

## 2014-06-20 ENCOUNTER — Ambulatory Visit (INDEPENDENT_AMBULATORY_CARE_PROVIDER_SITE_OTHER): Payer: Medicare Other

## 2014-06-20 DIAGNOSIS — Z23 Encounter for immunization: Secondary | ICD-10-CM

## 2014-07-18 ENCOUNTER — Other Ambulatory Visit: Payer: Self-pay

## 2014-07-18 MED ORDER — DONEPEZIL HCL 23 MG PO TABS: ORAL_TABLET | ORAL | Status: DC

## 2014-08-28 ENCOUNTER — Telehealth: Payer: Self-pay

## 2014-08-28 NOTE — Telephone Encounter (Signed)
Sonya Robertson called patient fell today, complaining of pain in back and pelvic area. Has made appointment to see Dr. Mariea Clonts tomorrow, would like orders to get mobile x-ray for lumbar and pelvis area. Ok verbal order, written order signed by  Dr. Mariea Clonts and faxed to [phone]. ICD codes W19.XXXA -fall; M54.9 back pain; M25.559 hip pain.

## 2014-08-29 ENCOUNTER — Non-Acute Institutional Stay: Payer: Medicare Other | Admitting: Internal Medicine

## 2014-08-29 ENCOUNTER — Encounter: Payer: Self-pay | Admitting: Internal Medicine

## 2014-08-29 VITALS — BP 122/78 | HR 66 | Temp 96.5°F | Wt 130.0 lb

## 2014-08-29 DIAGNOSIS — M545 Low back pain, unspecified: Secondary | ICD-10-CM

## 2014-08-29 DIAGNOSIS — I4891 Unspecified atrial fibrillation: Secondary | ICD-10-CM

## 2014-08-29 DIAGNOSIS — Z9181 History of falling: Secondary | ICD-10-CM

## 2014-08-29 DIAGNOSIS — M4180 Other forms of scoliosis, site unspecified: Secondary | ICD-10-CM | POA: Insufficient documentation

## 2014-08-29 DIAGNOSIS — I499 Cardiac arrhythmia, unspecified: Secondary | ICD-10-CM

## 2014-08-29 MED ORDER — ASPIRIN EC 325 MG PO TBEC: 325.0000 mg | DELAYED_RELEASE_TABLET | Freq: Every day | ORAL | Status: DC

## 2014-08-29 NOTE — Progress Notes (Signed)
Patient ID: Sonya Robertson, female   DOB: [birth year], 80 y.o.   MRN: [id number]   Location:  [facility]  Code Status: DNR  Allergies  Allergen Reactions  . Codeine   . Demerol [Meperidine]   . Sulfa Antibiotics     Chief Complaint  Patient presents with  . Fall    08/28/14, patient was setting down in chair and fell on her right side per husband. Had a lot of pain in lower back and pelvic area.  Here with husband and daughter Sonya Robertson    HPI: Patient is a 80 y.o. white female with h/o AD, DDD seen in the clinic today due to a fall 08/28/14 during an attempt at sitting down.  She fell on her right side.  She c/o pin in her lower back and pelvic area.  Now denies pain getting up and down.  Yesterday pain was much worse.  She was moving slowly and uncertainly.  Right sacroiliac area is painful.  Does not hurt when walks.  Sitting hurts.  Seems to have difficulty gaging where the chairs are.  Lighting is dim in room.  Has difficulty knowing if lines on parking lot are raised or flat.  Leans forward when her clothes is being buttoned.  Last eye appt approximately 6 mos ago.  There has been some discussion of cataract treatments.  Works out with a Clinical research associate.   Pt denies abdominal pain.  Is more restricted in her tastes than a few years ago.  Frequently says she needs to go to restroom--goes 1/3 times with urinating.  Constipation and on laxative leading to surprises sometimes unpleasant.      Review of Systems:  Review of Systems  Constitutional: Positive for malaise/fatigue. Negative for fever and chills.  HENT: Negative for congestion and hearing loss.   Respiratory: Negative for shortness of breath.   Cardiovascular: Negative for chest pain and palpitations.  Gastrointestinal: Positive for diarrhea and constipation. Negative for abdominal pain, blood in stool and melena.       Constipation, but laxatives cause occasional loose incontinent stools  Musculoskeletal: Positive for myalgias,  back pain and falls.       Unsteady gait, proximal muscle weakness  Skin: Negative for rash.  Neurological: Positive for dizziness and weakness. Negative for headaches.  Psychiatric/Behavioral: Positive for memory loss.    Past Medical History  Diagnosis Date  . Arthritis   . Alzheimer's disease   . Unspecified hereditary and idiopathic peripheral neuropathy 01/17/2009  . Allergic rhinitis due to pollen 2008  . Hyperlipidemia   . Urinary tract infection, site not specified 2007  . Urethral fistula 2007  . Benign neoplasm of colon 2007  . Anxiety   . Unspecified constipation 2007  . Degeneration of intervertebral disc, site unspecified 2007  . Senile osteoporosis 2007  . Insomnia, unspecified 2007  . Unspecified hypothyroidism   . B12 deficiency   . Unspecified essential hypertension     Past Surgical History  Procedure Laterality Date  . Tonsillectomy and adenoidectomy  1940  . Tubal ligation  1972  . Colonoscopy w/ biopsies and polypectomy  12/1999    adenomatous Dr. Verdia Kuba  . Colonoscopy w/ biopsies  05/10/2008    Dr. Collene Mares more prominent changes in sigmoid colon.     Social History:   reports that she has never smoked. She has never used smokeless tobacco. She reports that she does not drink alcohol or use illicit drugs.  Family History  Problem Relation Age of  Onset  . Cancer Mother     COLON  . Heart disease Father     MI    Medications: Patient's Medications  New Prescriptions   No medications on file  Previous Medications   ASPIRIN 81 MG TABLET    Take 81 mg by mouth daily.   CYANOCOBALAMIN (NASCOBAL) 500 MCG/0.1ML SOLN    USE 1 SPRAY INTRANASALLY every other week   DONEPEZIL (ARICEPT) 23 MG TABS TABLET    TAKE 1 TABLET ONCE DAILY FOR MEMORY.   NAMENDA XR 28 MG CP24    TAKE (1) CAPSULE DAILY.   POLYETHYLENE GLYCOL (MIRALAX / GLYCOLAX) PACKET    Take 17 g by mouth. Take one pack as needed for constipation   SYNTHROID 88 MCG TABLET    TAKE (1) TABLET DAILY  FOR THYROID.  Modified Medications   No medications on file  Discontinued Medications   No medications on file     Physical Exam: Filed Vitals:   08/29/14 1408  BP: 122/78  Pulse: 66  Temp: 96.5 F (35.8 C)  TempSrc: Oral  Weight: 130 lb (58.968 kg)  SpO2: 93%  Physical Exam  Constitutional: She appears well-developed and well-nourished. No distress.  Cardiovascular: Intact distal pulses.   irreg irreg  Pulmonary/Chest: Effort normal and breath sounds normal.  Abdominal: Soft. Bowel sounds are normal. She exhibits no distension.  Musculoskeletal: Normal range of motion.  Moderate to severe dextoscoliosis of thoracolumbar spine with tenderness over that area and on right side in paravertebral and costal region; can ambulate but holding her daughter's hands--shaking and has fear of falling  Neurological: She is alert.  Skin: Skin is warm and dry.  Some scaling over her scoliotic region of her back  Psychiatric:  Flat affect, answers questions mostly appropriately if specific and simple    Labs reviewed: Basic Metabolic Panel:  Recent Labs  01/01/14 03/14/14  NA 137 144  K 3.9 4.3  BUN  --  21  CREATININE 0.7 0.8  TSH  --  3.10   Liver Function Tests:  Recent Labs  03/14/14  AST 20  ALT 25  ALKPHOS 61  CBC:  Recent Labs  01/01/14  WBC 9.7  HGB 13.1  HCT 40  PLT 158   Lipid Panel:  Recent Labs  03/14/14  CHOL 255*  HDL 109*  LDLCALC 137  TRIG 49   Past Procedures: Xrays:   08/28/14 Lumbar spine--3 views:  Normal lordosis of lumbar spine with no subluxation.  Moderate lumbar dextroscoliosis.  There are no compression deformities.  The bony mineralization is mildly decreased.  Mild degenerative disc disease in thoracolumbar spine. 08/28/14:  Pelvis:  No definitive radiographic evidence of fx or dislocation.  If symptoms persist, f/u radiographs recommended.  Mild OP.  Mild OA.   Assessment/Plan 1. Right-sided low back pain without sciatica -seems  already improved after 24 hours -has used ice on her back so far with some benefit -worse when sitting -is in area just beneath her scoliotic region  2. Dextroscoliosis -seems severe to me though read as moderate -may have some chronic pain from this that was worsened due to her fall along with some myalgias and spasms--this area remains warm and tender to touch  -advised conservative measures with warm heat with compress or heating pad (supervised) OR topical warming cream/lotion like theragesic OR warming patches that may help with pain -trying to avoid narcotics due to dementia and risk of more confusion and falling with these  3. Irregular  heart rhythm -appears this is new -EKG obtained reveals presumably new onset atrial fibrillation with HR 87 on average; prolonged QTc 477, as well, but meds she takes should not cause this -will check TSH as none very recent (hyperthyroid could precipitate this) -will change asa 81mg  to 325mg  as anticoagulation--alternatives were discussed with her daughter and husband, and they agree with this plan  4. History of fall within past 90 days -now has fear of falling -discussed balance and need for good proximal muscle strength to keep her ambulatory for as long as possible -is expected to worsen with dementia progression however -Chrissy and pt's husband prefer to try increasing exercise regimen with personal trainer, Edsel Petrin before going to PT--if not improving, would order therapy and also my order f/u imaging if pain persists -moderate to severe dementia is biggest contributor--is on high dose aricept so cannot change to namzaric  Labs/tests ordered:  EKG done today  Next appt:  Keep 10/17/14 appt, return prn  [place] Nishi L. Harmonie Verrastro, D.O. [facility] [address], [place] [address] Cell Phone (Mon-Fri 8am-5pm):  [phone] On Call:  [phone] & follow prompts after 5pm & weekends Office Phone:   [phone] Office Fax:  [phone]

## 2014-08-31 LAB — TSH: TSH: 3.61 u[IU]/mL (ref 0.41–5.90)

## 2014-09-06 ENCOUNTER — Telehealth: Payer: Self-pay

## 2014-09-06 NOTE — Telephone Encounter (Signed)
Received 08/31/14 TSH 3.610, normal. Left message on patient's voice mail. Keep appt 10/17/13 with Dr. Mariea Clonts

## 2014-09-15 ENCOUNTER — Encounter: Payer: Self-pay | Admitting: Internal Medicine

## 2014-10-10 LAB — BASIC METABOLIC PANEL
BUN: 20 mg/dL (ref 4–21)
Creatinine: 0.7 mg/dL (ref 0.5–1.1)
GLUCOSE: 92 mg/dL
POTASSIUM: 4 mmol/L (ref 3.4–5.3)
SODIUM: 142 mmol/L (ref 137–147)

## 2014-10-10 LAB — LIPID PANEL
CHOLESTEROL: 261 mg/dL — AB (ref 0–200)
HDL: 86 mg/dL — AB (ref 35–70)
LDL Cholesterol: 150 mg/dL
LDl/HDL Ratio: 1.7
Triglycerides: 86 mg/dL (ref 40–160)

## 2014-10-10 LAB — TSH: TSH: 3.05 u[IU]/mL (ref 0.41–5.90)

## 2014-10-11 ENCOUNTER — Telehealth: Payer: Self-pay

## 2014-10-11 NOTE — Telephone Encounter (Signed)
Called and spoke to patient and her and her husband will call back to schedule apt. With [employer]. She was scheduled . For . 3--1-16. At 2:00 pm.  CM/D[place]. When patient calls please put patient on [place]  schedule.

## 2014-10-16 ENCOUNTER — Encounter: Payer: Self-pay | Admitting: Internal Medicine

## 2014-10-17 ENCOUNTER — Encounter: Payer: Self-pay | Admitting: Internal Medicine

## 2014-10-17 ENCOUNTER — Non-Acute Institutional Stay: Payer: Medicare Other | Admitting: Internal Medicine

## 2014-10-17 VITALS — BP 102/74 | HR 68 | Temp 98.1°F | Wt 126.0 lb

## 2014-10-17 DIAGNOSIS — Z7189 Other specified counseling: Secondary | ICD-10-CM

## 2014-10-17 DIAGNOSIS — E559 Vitamin D deficiency, unspecified: Secondary | ICD-10-CM

## 2014-10-17 DIAGNOSIS — R197 Diarrhea, unspecified: Secondary | ICD-10-CM

## 2014-10-17 DIAGNOSIS — M545 Low back pain, unspecified: Secondary | ICD-10-CM

## 2014-10-17 DIAGNOSIS — G309 Alzheimer's disease, unspecified: Secondary | ICD-10-CM

## 2014-10-17 DIAGNOSIS — I4891 Unspecified atrial fibrillation: Secondary | ICD-10-CM

## 2014-10-17 DIAGNOSIS — F0280 Dementia in other diseases classified elsewhere without behavioral disturbance: Secondary | ICD-10-CM

## 2014-10-17 DIAGNOSIS — Z23 Encounter for immunization: Secondary | ICD-10-CM

## 2014-10-17 DIAGNOSIS — M4180 Other forms of scoliosis, site unspecified: Secondary | ICD-10-CM

## 2014-10-17 MED ORDER — VITAMIN D 50 MCG (2000 UT) PO CAPS: 1.0000 | ORAL_CAPSULE | Freq: Every day | ORAL | Status: DC

## 2014-10-17 NOTE — Progress Notes (Signed)
Patient ID: Sonya Robertson, female   DOB: 1933/05/01, 81 y.o.   MRN: [id number]   Location:  [facility]  Code Status: DNR Advanced Directive information Does patient have an advance directive?: Yes, Type of Advance Directive: Out of facility DNR (pink MOST or yellow form), Pre-existing out of facility DNR order (yellow form or pink MOST form): Pink MOST form placed in chart (order not valid for inpatient use)  Allergies  Allergen Reactions  . Codeine   . Demerol [Meperidine]   . Sulfa Antibiotics     Chief Complaint  Patient presents with  . Medical Management of Chronic Issues    blood pressure, anxiety, Alzheimers, thyroid, cholesterol. Here with husband    HPI: Patient is a 81 y.o. white female seen in the office today for med mgt of chronic diseases.   Had fallen before last visit.   Pain improved--denies any at present.   Has been using walker which helped considerably.  Can move quickly when uses it.  Sometimes forgets it on the way back.   No palpitations.  Nothing has come to her husband's attention either. Is much more alert today, talking to me.  Denies pain.   TSH was normal.   Bad cholesterol elevated, good cholesterol protective.   Having difficulty with loose stools now.  Previously had constipation problems.  Avoids most vegetables.   Metamucil used for many years with benefit in past.      Review of Systems:  Review of Systems  Constitutional: Negative for fever and chills.  HENT: Negative for congestion and hearing loss.   Eyes:       Glasses  Respiratory: Negative for shortness of breath.   Cardiovascular: Negative for chest pain and leg swelling.  Gastrointestinal: Positive for constipation. Negative for abdominal pain, diarrhea, blood in stool and melena.  Genitourinary: Negative for dysuria.  Musculoskeletal: Negative for falls.       No falls since last visit  Neurological: Negative for dizziness and weakness.  Endo/Heme/Allergies:  Bruises/bleeds easily.  Psychiatric/Behavioral: Positive for memory loss.     Past Medical History  Diagnosis Date  . Arthritis   . Alzheimer's disease   . Unspecified hereditary and idiopathic peripheral neuropathy 01/17/2009  . Allergic rhinitis due to pollen 2008  . Hyperlipidemia   . Urinary tract infection, site not specified 2007  . Urethral fistula 2007  . Benign neoplasm of colon 2007  . Anxiety   . Unspecified constipation 2007  . Degeneration of intervertebral disc, site unspecified 2007  . Senile osteoporosis 2007  . Insomnia, unspecified 2007  . Unspecified hypothyroidism   . B12 deficiency   . Unspecified essential hypertension     Past Surgical History  Procedure Laterality Date  . Tonsillectomy and adenoidectomy  1940  . Tubal ligation  1972  . Colonoscopy w/ biopsies and polypectomy  12/1999    adenomatous Dr. Verdia Kuba  . Colonoscopy w/ biopsies  05/10/2008    Dr. Collene Mares more prominent changes in sigmoid colon.     Social History:   reports that she has never smoked. She has never used smokeless tobacco. She reports that she does not drink alcohol or use illicit drugs.  Family History  Problem Relation Age of Onset  . Cancer Mother     COLON  . Heart disease Father     MI    Medications: Patient's Medications  New Prescriptions   No medications on file  Previous Medications   ASPIRIN EC 325 MG  TABLET    Take 1 tablet (325 mg total) by mouth daily.   CYANOCOBALAMIN (NASCOBAL) 500 MCG/0.1ML SOLN    USE 1 SPRAY INTRANASALLY every other week   DONEPEZIL (ARICEPT) 23 MG TABS TABLET    TAKE 1 TABLET ONCE DAILY FOR MEMORY.   LOPERAMIDE (IMODIUM A-D) 2 MG TABLET    Take 2 mg by mouth. Take 1-2 tablets daily for diarrhea as needed   NAMENDA XR 28 MG CP24    TAKE (1) CAPSULE DAILY.   POLYETHYLENE GLYCOL (MIRALAX / GLYCOLAX) PACKET    Take 17 g by mouth. Take one pack as needed for constipation   SYNTHROID 88 MCG TABLET    TAKE (1) TABLET DAILY FOR THYROID.    Modified Medications   No medications on file  Discontinued Medications   No medications on file   Physical Exam: Filed Vitals:   10/17/14 1606  BP: 102/74  Pulse: 68  Temp: 98.1 F (36.7 C)  TempSrc: Oral  Weight: 126 lb (57.153 kg)  Physical Exam  Constitutional: She appears well-developed and well-nourished. No distress.  Cardiovascular: Normal rate, regular rhythm, normal heart sounds and intact distal pulses.   Pulmonary/Chest: Effort normal and breath sounds normal. No respiratory distress.  Abdominal: Soft. Bowel sounds are normal. She exhibits no distension and no mass. There is no tenderness.  Musculoskeletal: Normal range of motion. She exhibits no tenderness.  Can ambulate w/o assistive device as she did today with her husband with her, but does much better with the walker when she remembers it  Neurological: She is alert.  Pleasant and conversive today when asked questions  Skin: Skin is warm and dry.     Labs reviewed: Basic Metabolic Panel:  Recent Labs  01/01/14 03/14/14 08/31/14 10/10/14  NA 137 144  --  142  K 3.9 4.3  --  4.0  BUN  --  21  --  20  CREATININE 0.7 0.8  --  0.7  TSH  --  3.10 3.61 3.05   Liver Function Tests:  Recent Labs  03/14/14  AST 20  ALT 25  ALKPHOS 61   No results for input(s): LIPASE, AMYLASE in the last 8760 hours. No results for input(s): AMMONIA in the last 8760 hours. CBC:  Recent Labs  01/01/14  WBC 9.7  HGB 13.1  HCT 40  PLT 158   Lipid Panel:  Recent Labs  03/14/14 10/10/14  CHOL 255* 261*  HDL 109* 86*  LDLCALC 137 150  TRIG 49 86   No results found for: HGBA1C   Assessment/Plan 1. Vitamin D deficiency -discussed that most patients require 2000 to 4000 units regularly each day to get therapeutic levels of >40 - Cholecalciferol (VITAMIN D) 2000 UNITS CAPS; Take 1 capsule (2,000 Units total) by mouth daily.  Dispense: 30 capsule; Refill: 3  2. Right-sided low back pain without sciatica -also  improved, not needing meds for this  3. Dextroscoliosis -not having pain at this time -is more ambulatory with walker  4. Atrial fibrillation, unspecified -we had an extensive discussion last visit about anticoagulation and decided to increase her dose of asa from 81 to 325mg  due to the newly discovered afib and her frequent falling rather than going to eliquis or xarelto -rate has been controlled -suspect that she fell when this began, but cannot be sure  5. Alzheimer's disease -continues on aricept 23mg  and namenda XR 28mg .  I have not switched her to namzaric due to the high dose aricept -is  moderate stage at this time functionally--she lives in independent living with her husband MMSE - Mini Mental State Exam 05/02/2014 03/22/2014 06/13/2013  Orientation to time 0 1 2  Orientation to Place 1 0 1  Registration 3 0 3  Attention/ Calculation 0 0 0  Recall 0 0 2  Language- name 2 objects 2 1 1   Language- repeat 1 1 0  Language- follow 3 step command 3 3 3   Language- read & follow direction 1 1 1   Write a sentence 0 1 1  Copy design 0 0 0  Total score 11 8 14    6. Diarrhea - has been problematic using the senna and miralax combination so will try to use metamucil   7. Need for vaccination with 13-polyvalent pneumococcal conjugate vaccine - Pneumococcal conjugate vaccine 13-valent prevnar given  8.  Counseling re: advanced directives and goals of care:  MOST form was completed and will be scanned in her record and kept with the DNR on their refrigerator as is customary [facility]  Labs/tests ordered:tsh, bmp before next visit Next appt:  3 mos  Rewa Weissberg L. Arsen Mangione, D.O. [facility] [address], [place] [address] Cell Phone (Mon-Fri 8am-5pm):  [phone] On Call:  [phone] & follow prompts after 5pm & weekends Office Phone:  [phone] Office Fax:  [phone]

## 2014-10-21 DIAGNOSIS — M545 Low back pain, unspecified: Secondary | ICD-10-CM | POA: Insufficient documentation

## 2014-10-21 DIAGNOSIS — E559 Vitamin D deficiency, unspecified: Secondary | ICD-10-CM | POA: Insufficient documentation

## 2014-10-21 DIAGNOSIS — R197 Diarrhea, unspecified: Secondary | ICD-10-CM | POA: Insufficient documentation

## 2014-10-21 DIAGNOSIS — I4891 Unspecified atrial fibrillation: Secondary | ICD-10-CM | POA: Insufficient documentation

## 2014-10-31 ENCOUNTER — Ambulatory Visit: Payer: Medicare Other | Admitting: Nurse Practitioner

## 2014-11-03 ENCOUNTER — Encounter: Payer: Self-pay | Admitting: Nurse Practitioner

## 2014-11-03 ENCOUNTER — Ambulatory Visit (INDEPENDENT_AMBULATORY_CARE_PROVIDER_SITE_OTHER): Payer: Medicare Other | Admitting: Nurse Practitioner

## 2014-11-03 VITALS — BP 138/82 | HR 55 | Ht 65.5 in | Wt 127.4 lb

## 2014-11-03 DIAGNOSIS — E538 Deficiency of other specified B group vitamins: Secondary | ICD-10-CM

## 2014-11-03 DIAGNOSIS — G309 Alzheimer's disease, unspecified: Secondary | ICD-10-CM

## 2014-11-03 DIAGNOSIS — F0280 Dementia in other diseases classified elsewhere without behavioral disturbance: Secondary | ICD-10-CM

## 2014-11-03 MED ORDER — DONEPEZIL HCL 23 MG PO TABS: ORAL_TABLET | ORAL | Status: DC

## 2014-11-03 MED ORDER — MEMANTINE HCL ER 28 MG PO CP24: ORAL_CAPSULE | ORAL | Status: DC

## 2014-11-03 NOTE — Patient Instructions (Signed)
Continue Namenda at current dose will refill Continue Aricept at current dose will refill Continue exercise program with trainer Follow-up in 6-8 months

## 2014-11-03 NOTE — Progress Notes (Signed)
[facility]  PATIENT: Sonya Robertson DOB: [birth year]   REASON FOR VISIT: Follow-up for memory loss, B12 deficiency  HISTORY FROM: Patient husband and daughter    HISTORY OF PRESENT ILLNESS: Sonya Robertson, 81 year old female returns for follow-up with her husband and daughter. She has a history of memory loss. Patient claims memory is about the same as does the daughter and husband she is currently residing in independent living at [facility]. She has paid caregivers during the day. She needs help with bathing and dressing. She walks to the dining room at night for her supper meal with a walker. Recently diagnosed with arrhythmia and placed on full strength aspirin. Has mild diarrhea but that is getting better according to the husband. She is less involved in activities, husband takes care of the finances. She does have a personal trainer come into the apartment 4 days a week for exercise MRI of the brain in the past has shown mild atrophy and small vessel disease. EEG with slowing she also has a B12 deficiency and takes nasal spray. She returns for reevaluation. She is currently on Namenda and Aricept needs refills.    HISTORY: Sonya Robertson is an established patient of our practice, has been followed for memory loss. The patient undergoes serial Mini-Mental Status Examination upon visits and seems to have more difficulties to oriented to time and place, date- score today 11/30 points and generated 3 words on animal fluency. She was not able to copy an image. The geriatric depression scale was endorsed at 0 points. She also has more trouble generating words and has actually cut becomes quite sparse with verbal output. She had a PCP visit with labs on 03-14-14 , her lipids are high, all metabolic tests were normal. She remains on B 12 Nasal spray once a week.  She is taking Aricept and Namenda XR at highest commercial dose without side effects.   In comparison to last visit , the  patient scored 11 points , And 5 on the animal fluency test last year . She did not copy an image or write a phase. She was not oriented to place or time. The patient is here accompanied by her family , the family ( husband and daughter ) indicated her sparse word flow, she is unaware or in denial of aphasia, hesitation of speech. Her husband denies finding her depressed and is not reporting hallucinations. The patients only concern is constipation.   Per patient's recollection ; she rises at about 7 AM, wakes up without alarm. Breakfast with cereal and coffee. She walks and runs errands, she works out with a trainer 4 days a week. [facility]. Sonya Robertson is her assistant, 4 days a week for shower and laundry . Monday she has lunch and runs errands with a friend.     REVIEW OF SYSTEMS: Full 14 system review of systems performed and notable only for those listed, all others are neg:  Constitutional: neg  Cardiovascular: neg Ear/Nose/Throat: neg  Skin: neg Eyes: neg Respiratory: neg Gastroitestinal: Diarrhea which is getting better Hematology/Lymphatic: neg  Endocrine: neg Musculoskeletal:neg Allergy/Immunology: neg Neurological: neg Psychiatric: neg Sleep : neg   ALLERGIES: Allergies  Allergen Reactions  . Codeine   . Demerol [Meperidine]   . Sulfa Antibiotics     HOME MEDICATIONS: Outpatient Prescriptions Prior to Visit  Medication Sig Dispense Refill  . aspirin EC 325 MG tablet Take 1 tablet (325 mg total) by mouth daily. 30 tablet 0  . Cholecalciferol (VITAMIN D)  2000 UNITS CAPS Take 1 capsule (2,000 Units total) by mouth daily. 30 capsule 3  . Cyanocobalamin (NASCOBAL) 500 MCG/0.1ML SOLN USE 1 SPRAY INTRANASALLY every other week    . donepezil (ARICEPT) 23 MG TABS tablet TAKE 1 TABLET ONCE DAILY FOR MEMORY. 90 tablet 1  . NAMENDA XR 28 MG CP24 TAKE (1) CAPSULE DAILY. 30 capsule 5  . SYNTHROID 88 MCG tablet TAKE (1) TABLET DAILY FOR THYROID. 30 tablet 5   No  facility-administered medications prior to visit.    PAST MEDICAL HISTORY: Past Medical History  Diagnosis Date  . Arthritis   . Alzheimer's disease   . Unspecified hereditary and idiopathic peripheral neuropathy 01/17/2009  . Allergic rhinitis due to pollen 2008  . Hyperlipidemia   . Urinary tract infection, site not specified 2007  . Urethral fistula 2007  . Benign neoplasm of colon 2007  . Anxiety   . Unspecified constipation 2007  . Degeneration of intervertebral disc, site unspecified 2007  . Senile osteoporosis 2007  . Insomnia, unspecified 2007  . Unspecified hypothyroidism   . B12 deficiency   . Unspecified essential hypertension     PAST SURGICAL HISTORY: Past Surgical History  Procedure Laterality Date  . Tonsillectomy and adenoidectomy  1940  . Tubal ligation  1972  . Colonoscopy w/ biopsies and polypectomy  12/1999    adenomatous Dr. Verdia Kuba  . Colonoscopy w/ biopsies  05/10/2008    Dr. Collene Mares more prominent changes in sigmoid colon.     FAMILY HISTORY: Family History  Problem Relation Age of Onset  . Cancer Mother     COLON  . Heart disease Father     MI    SOCIAL HISTORY: History   Social History  . Marital Status: Married    Spouse Name: Shanon Brow  . Number of Children: 2  . Years of Education: 18   Occupational History  . Not on file.   Social History Main Topics  . Smoking status: Never Smoker   . Smokeless tobacco: Never Used  . Alcohol Use: No  . Drug Use: No  . Sexual Activity: Yes    Birth Control/ Protection: None   Other Topics Concern  . Not on file   Social History Narrative   Patient is married(David) and lives with her husband.   Patient has two children.   Patient is retired.   Patient has a college education. Masters   Patient is right-handed.   Patient does not drink any caffeine.     PHYSICAL EXAM  Filed Vitals:   11/03/14 1020  BP: 138/82  Pulse: 55  Height: 5' 5.5" (1.664 m)  Weight: 127 lb 6.4 oz (57.788 kg)    Body mass index is 20.87 kg/(m^2). General: well developed, frail female seated, in no evident distress, the patient is slender , well groomed and friendly.  Head: head normocephalic and atraumatic. Oropharynx benign Neck: supple with no carotid bruits Cardiovascular: regular rate and rhythm, no murmurs  Neurologic Exam Mental Status: Awake and alert. MMSE 1/30 , the patient had poor effort refused to do AFT and clock drawing. Geriatric depression scale was 1 . Follows some  commands. Speech and language normal - she has hearing aids.  Cranial Nerves: Pupils equal, briskly reactive to light. Extraocular movements full without nystagmus. Visual fields full to confrontation.  Right ptosis pre-existing, Hearing intact with aids , and symmetric to finger snap.Facial sensation intact. Face, tongue, palate move normally and symmetrically. Neck flexion and extension normal.  Motor:  Normal bulk and tone. Normal strength in all tested extremity muscles.No focal weakness Sensory.: intact to touch and pinprick and vibratory. The patient shows no signs of extinction or neglect. Coordination: Rapid alternating movements normal in all extremities. Finger-to-nose and heel-to-shin performed with  dysmetria. Mild resting tremor.  Gait and Station: Arises from chair with assistance. Stance is wide  based  and mild difficulty with turning. No assistive device. Did not tandem due to safety concerns and recent fall  Reflexes: 1+ and symmetric. Toes downgoing.     DIAGNOSTIC DATA (LABS, IMAGING, TESTING) - I reviewed patient records, labs, notes, testing and imaging myself where available.      Component Value Date/Time   NA 142 10/10/2014   NA 141 07/08/2012 1627   K 4.0 10/10/2014   CL 106 07/08/2012 1627   CO2 30 07/08/2012 1627   GLUCOSE 77 07/08/2012 1627   BUN 20 10/10/2014   BUN 25* 07/08/2012 1627   CREATININE 0.7 10/10/2014   CREATININE 0.64 07/08/2012 1627   CALCIUM 9.4 07/08/2012  1627   PROT 6.7 07/08/2012 1627   ALBUMIN 4.4 07/08/2012 1627   AST 20 03/14/2014   ALT 25 03/14/2014   ALKPHOS 61 03/14/2014   BILITOT 0.7 07/08/2012 1627   Lab Results  Component Value Date   CHOL 261* 10/10/2014   HDL 86* 10/10/2014   LDLCALC 150 10/10/2014   TRIG 86 10/10/2014    Lab Results  Component Value Date   TSH 3.05 10/10/2014      ASSESSMENT AND PLAN  81 y.o. year old female  has a past medical history of Alzheimer's disease progressive and B12 deficiency. Recently diagnosed with atrial fibrillation and placed on 325 aspirin. She had a fall prior to that.  Continue Namenda at current dose will refill Continue Aricept at current dose will refill Continue exercise program with trainer Use walker for safe ambulation Follow-up in 6-8 months Dennie Bible, [facility], [facility], APRN  [facility] [address], [place], [place] [address] [phone]

## 2014-11-06 NOTE — Progress Notes (Signed)
I agree with the assessment and plan as directed by NP .The patient is known to me .   Artisha Capri, MD  

## 2014-11-17 ENCOUNTER — Other Ambulatory Visit: Payer: Self-pay | Admitting: Neurology

## 2014-12-30 ENCOUNTER — Other Ambulatory Visit: Payer: Self-pay | Admitting: Internal Medicine

## 2015-01-16 ENCOUNTER — Other Ambulatory Visit: Payer: Self-pay | Admitting: Neurology

## 2015-01-16 LAB — TSH: TSH: 2.3 u[IU]/mL (ref 0.41–5.90)

## 2015-01-16 LAB — BASIC METABOLIC PANEL
BUN: 17 mg/dL (ref 4–21)
Creatinine: 0.7 mg/dL (ref 0.5–1.1)
Glucose: 91 mg/dL
Potassium: 4.1 mmol/L (ref 3.4–5.3)
Sodium: 140 mmol/L (ref 137–147)

## 2015-01-23 ENCOUNTER — Encounter: Payer: Self-pay | Admitting: Internal Medicine

## 2015-01-23 ENCOUNTER — Non-Acute Institutional Stay: Payer: Medicare Other | Admitting: Internal Medicine

## 2015-01-23 VITALS — BP 110/70 | HR 72 | Wt 126.0 lb

## 2015-01-23 DIAGNOSIS — K5901 Slow transit constipation: Secondary | ICD-10-CM

## 2015-01-23 DIAGNOSIS — F0280 Dementia in other diseases classified elsewhere without behavioral disturbance: Secondary | ICD-10-CM

## 2015-01-23 DIAGNOSIS — E039 Hypothyroidism, unspecified: Secondary | ICD-10-CM | POA: Diagnosis not present

## 2015-01-23 DIAGNOSIS — I4891 Unspecified atrial fibrillation: Secondary | ICD-10-CM

## 2015-01-23 DIAGNOSIS — G309 Alzheimer's disease, unspecified: Secondary | ICD-10-CM | POA: Diagnosis not present

## 2015-01-23 DIAGNOSIS — E785 Hyperlipidemia, unspecified: Secondary | ICD-10-CM

## 2015-01-23 NOTE — Progress Notes (Signed)
Patient ID: Sonya Robertson, female   DOB: 01-05-1933, 81 y.o.   MRN: [id number]   Location:  [facility]  Code Status: DNR  Goals of Care:Advanced Directive information Does patient have an advance directive?: Yes, Type of Advance Directive: [place];Out of facility DNR (pink MOST or yellow form), Pre-existing out of facility DNR order (yellow form or pink MOST form): Yellow form placed in chart (order not valid for inpatient use), Does patient want to make changes to advanced directive?: No - Patient declined  Chief Complaint  Patient presents with  . Medical Management of Chronic Issues    Alzheimers, blood pressure, anxiety. Here with husband    HPI: Patient is a 68 y.o. white female seen in the [facility] today for med mgt of chronic diseases.    Up to date on all vaccines.    Sonya Robertson is doing well--eats and sleeps well.  Is gradually losing function and management of toilet is a time-consuming activity.  They do have three caregivers to help--one comes for am routine.  Gets bored in the daytime.  Responds well to any stimulation whatsoever.  She works with a Transport planner. They attend the [employer] which is entertaining to her.  One more fall since the time of the severe fall with back pain I had seen her about in January.    Review of Systems:  Review of Systems  Constitutional: Negative for fever, chills, weight loss and malaise/fatigue.  HENT: Negative for congestion and hearing loss.   Eyes: Negative for blurred vision.  Respiratory: Negative for cough and shortness of breath.   Cardiovascular: Negative for chest pain.  Gastrointestinal: Positive for constipation. Negative for abdominal pain, blood in stool and melena.       Responds with bm every other day with use of metamucil--denies abdominal pain and pain during the BMs; appetite is good  Genitourinary: Negative for dysuria.  Musculoskeletal: Negative for falls.  Skin: Negative for  rash.  Neurological: Negative for dizziness, weakness and headaches.  Psychiatric/Behavioral: Positive for memory loss. The patient does not have insomnia.     Past Medical History  Diagnosis Date  . Arthritis   . Alzheimer's disease   . Unspecified hereditary and idiopathic peripheral neuropathy 01/17/2009  . Allergic rhinitis due to pollen 2008  . Hyperlipidemia   . Urinary tract infection, site not specified 2007  . Urethral fistula 2007  . Benign neoplasm of colon 2007  . Anxiety   . Unspecified constipation 2007  . Degeneration of intervertebral disc, site unspecified 2007  . Senile osteoporosis 2007  . Insomnia, unspecified 2007  . Unspecified hypothyroidism   . B12 deficiency   . Unspecified essential hypertension     Past Surgical History  Procedure Laterality Date  . Tonsillectomy and adenoidectomy  1940  . Tubal ligation  1972  . Colonoscopy w/ biopsies and polypectomy  12/1999    adenomatous Dr. Verdia Kuba  . Colonoscopy w/ biopsies  05/10/2008    Dr. Collene Mares more prominent changes in sigmoid colon.     Social History:   reports that she has never smoked. She has never used smokeless tobacco. She reports that she does not drink alcohol or use illicit drugs.  Allergies  Allergen Reactions  . Codeine   . Demerol [Meperidine]   . Sulfa Antibiotics     Medications: Patient's Medications  New Prescriptions   No medications on file  Previous Medications   ASPIRIN EC 325 MG TABLET  Take 1 tablet (325 mg total) by mouth daily.   CHOLECALCIFEROL (VITAMIN D) 2000 UNITS CAPS    Take 1 capsule (2,000 Units total) by mouth daily.   CYANOCOBALAMIN 500 MCG/0.1ML SOLN    Place into the nose. One spray intranasally every 2 weeks.   DONEPEZIL (ARICEPT) 23 MG TABS TABLET    TAKE 1 TABLET ONCE DAILY FOR MEMORY.   NAMENDA XR 28 MG CP24 24 HR CAPSULE    TAKE (1) CAPSULE DAILY.   PSYLLIUM (METAMUCIL PO)    Take by mouth daily.   SYNTHROID 88 MCG TABLET    TAKE (1) TABLET DAILY FOR  THYROID.  Modified Medications   No medications on file  Discontinued Medications   NASCOBAL 500 MCG/0.1ML SOLN    USE 1 SPRAY INTRANASALLY ONCE A WEEK.     Physical Exam: Filed Vitals:   01/23/15 1455  BP: 110/70  Pulse: 72  Weight: 126 lb (57.153 kg)  SpO2: 99%   Body mass index is 20.64 kg/(m^2). Physical Exam  Constitutional: She appears well-developed and well-nourished. No distress.  Eyes:  glasses  Cardiovascular:  irreg irreg  Pulmonary/Chest: Effort normal and breath sounds normal. No respiratory distress.  Abdominal: Soft. Bowel sounds are normal. She exhibits no distension and no mass. There is no tenderness.  Musculoskeletal: Normal range of motion.  Neurological: She is alert.  Oriented to person and place  Skin: Skin is warm and dry.  Psychiatric: She has a normal mood and affect.     Labs reviewed: Basic Metabolic Panel:  Recent Labs  03/14/14 08/31/14 10/10/14 01/16/15  NA 144  --  142 140  K 4.3  --  4.0 4.1  BUN 21  --  20 17  CREATININE 0.8  --  0.7 0.7  TSH 3.10 3.61 3.05 2.30   Liver Function Tests:  Recent Labs  03/14/14  AST 20  ALT 25  ALKPHOS 61  Lipid Panel:  Recent Labs  03/14/14 10/10/14  CHOL 255* 261*  HDL 109* 86*  LDLCALC 137 150  TRIG 49 86   Patient Care Team: Gayland Curry, DO as PCP - General (Geriatric Medicine) Juanita Craver, MD as Consulting Physician (Gastroenterology) Almedia Balls, MD as Consulting Physician (Orthopedic Surgery) [facility]  Assessment/Plan 1. Alzheimer's disease -pt with moderate Alzheimer's disease--progressing functional losses--she is on max aricept and namenda therapy; she has three caregivers that come to help her husband look after her -she's fallen only once since Jan and continues to enjoy being active in the community especially attending music programs -she has a MOST form we completed previously and filed in media   2. Atrial fibrillation, unspecified -cont  asa 325mg  daily -her husband says less is more and they are hesitant to try a new agent like xarelto as a blood thinner for her (discussed again today since it seems her afib may be permanent now) -her rate has remained well controlled--we discussed what she might look like if it was not and how to check her pulse  3. Hypothyroidism, unspecified hypothyroidism type -TSH was wnl on labs 5/17, cont current synthroid, recheck in 6 mos to a year  4. Hyperlipidemia -LDL is above goal of 100 (150) -not on statin at present, but trying to avoid adding meds to her regimen due to her dementia at this time, "less is more" perspective  5. Slow transit constipation -cont metamucil, having bm every other day   Labs/tests ordered:  No new today Next appt:  3 mos.    Janal Haak L. Lucky Alverson, D.O. [facility] [address], [place] [address] Cell Phone (Mon-Fri 8am-5pm):  [phone] On Call:  [phone] & follow prompts after 5pm & weekends Office Phone:  [phone] Office Fax:  [phone]

## 2015-01-26 ENCOUNTER — Telehealth: Payer: Self-pay

## 2015-01-26 NOTE — Telephone Encounter (Signed)
Patient's ins will not cover Nascobal (Cyanocobalamin) Spray, it is excluded from the plan.  No alternative would be covered either, as they categorize these meds as supplements.  Would you like to change to a different medication.  (Even with discount 1 bottle of Nascobal would be $150)  Please advise.  Thank you.   Patent's spouse was inquiring about a Rx for Nascobal (Cyanocobalamin) Spray.  By viewing Rx history, we did approve 3 bottles with 4 refills on 05/17.  I called the pharmacy.  They verified they did get the Rx.  It appears this drug is not covered on ins.  They provided a number to call, [phone].  I called this number.  Spoke with Geni Bers.  Was advised this is not the correct number.  Asked that I call [phone] instead.  I called this number and spoke with [facility].  She said Nascobal is excluded from the patient's formulary, and no prior auth could be applied.  As well, said there are no covered alternatives, as the plan categorizes these as supplements, and therefore they are excluded from the plan.  I tried to locate a discount card online, however the cost would still be $150 per bottle.  I called Mr Irish Elders back to advise a message has been forwarded to provider for review.  He was not available, left message.

## 2015-01-26 NOTE — Telephone Encounter (Signed)
The last B12 I see was high at 1185 in July 2015.  I recommend we recheck her b12 before her next appointment with me to determine if she needs to continue B12 unless another level was done at the neurology office.    If we have a low level, then we could start her on B12 IM monthly based on that result.

## 2015-01-26 NOTE — Telephone Encounter (Signed)
The only alternative to nasal B12 would be an injection, which could be given once a month. In many cases a primary care physician will apply this sometimes home health aides or a relative with medical education. Since her nasal spray for cyanocobalamin in is no longer covered, I will ask her to take oral B12 in the meantime and discuss with her primary care physician if she can get injections through his office. We do not give vitamin B12 injections here on a regular basis.

## 2015-01-26 NOTE — Telephone Encounter (Signed)
I called Mr Vahle back.  Got no answer.  Left message relaying providers note.  As well, I called the pharmacy and spoke with [facility].  Relayed providers message.  She verbalized understanding, and said they will try to reach out to the patient/spouse as well and will call us back if anything further is needed.

## 2015-02-09 NOTE — Telephone Encounter (Signed)
Spoke with Mr. Paul, will come in for lab 02/22/15 at 7:00. Will let [place] know

## 2015-02-09 NOTE — Telephone Encounter (Signed)
Left message for Mr. Dasher to call back.

## 2015-02-23 ENCOUNTER — Telehealth: Payer: Self-pay

## 2015-02-23 NOTE — Telephone Encounter (Signed)
Called left message on voice mail Vitamin B12 level ok, 1024 was 1153 on 03/14/14. No change in medications.

## 2015-03-13 ENCOUNTER — Encounter: Payer: Self-pay | Admitting: Internal Medicine

## 2015-04-20 ENCOUNTER — Other Ambulatory Visit: Payer: Self-pay | Admitting: Neurology

## 2015-04-25 ENCOUNTER — Non-Acute Institutional Stay: Payer: Medicare Other | Admitting: Internal Medicine

## 2015-04-25 ENCOUNTER — Encounter: Payer: Self-pay | Admitting: Internal Medicine

## 2015-04-25 VITALS — BP 100/64 | HR 80 | Temp 97.5°F | Wt 128.0 lb

## 2015-04-25 DIAGNOSIS — F0280 Dementia in other diseases classified elsewhere without behavioral disturbance: Secondary | ICD-10-CM

## 2015-04-25 DIAGNOSIS — E039 Hypothyroidism, unspecified: Secondary | ICD-10-CM

## 2015-04-25 DIAGNOSIS — E538 Deficiency of other specified B group vitamins: Secondary | ICD-10-CM

## 2015-04-25 DIAGNOSIS — R5383 Other fatigue: Secondary | ICD-10-CM | POA: Diagnosis not present

## 2015-04-25 DIAGNOSIS — I4891 Unspecified atrial fibrillation: Secondary | ICD-10-CM | POA: Diagnosis not present

## 2015-04-25 DIAGNOSIS — G309 Alzheimer's disease, unspecified: Secondary | ICD-10-CM

## 2015-04-25 DIAGNOSIS — K5901 Slow transit constipation: Secondary | ICD-10-CM

## 2015-04-25 NOTE — Progress Notes (Signed)
Patient ID: Sonya Robertson, female   DOB: 12-02-1932, 81 y.o.   MRN: [id number]   Location:  [facility]  Code Status: DNR  Goals of Care:Advanced Directive information Does patient have an advance directive?: Yes, Type of Advance Directive: [place];Living will;Out of facility DNR (pink MOST or yellow form), Pre-existing out of facility DNR order (yellow form or pink MOST form): Yellow form placed in chart (order not valid for inpatient use), Does patient want to make changes to advanced directive?: No - Patient declined  Chief Complaint  Patient presents with  . Medical Management of Chronic Issues    Alzheimer's, blood pressure, anxiety, thyroid    HPI: Patient is a 81 y.o. white female seen in the [facility] today for med mgt of chronic diseases.  Her husband mentions some difficulty with abdominal pain.  EKG performed due to lethargic episode.     Her husband note hat she does recognize that they have reached the stairwell and are going to go up and down steps.  Needs more help with brushing teeth, finding bathroom and how to take pills.  Had 82th bday party.    One fall when trying to sit on the toilet--no injuries.  Was when caregiver had been with her.    No pain.    Seeing well.  Hearing ok--has hearing aides.  Sleeps well up to a certain point.  Her husband notes she is hard to awaken in the morning.   Every other day BMs.  Her husband is not always aware if she 's gone due to caregivers.  Is doing well with the metamucil.  Using 1tsp after each meal as much as they remember.  Keep water bottle handy.    She has quit drinking wine.  Also stopped eating hard boiled eggs for breakfast.  She eats quite a bit of fish.    Doing exercise 3-4x per week.    Review of Systems:  Review of Systems  Constitutional: Negative for fever and chills.  HENT: Negative for congestion.   Eyes: Negative for blurred vision.       Glasses  Respiratory:  Negative for shortness of breath.   Cardiovascular: Negative for chest pain and leg swelling.  Gastrointestinal: Positive for constipation. Negative for abdominal pain, diarrhea, blood in stool and melena.  Genitourinary: Negative for dysuria.  Musculoskeletal: Negative for falls.  Skin: Negative for itching and rash.  Neurological: Negative for dizziness and loss of consciousness.  Psychiatric/Behavioral: Positive for memory loss. Negative for depression. The patient is not nervous/anxious and does not have insomnia.     Past Medical History  Diagnosis Date  . Arthritis   . Alzheimer's disease   . Unspecified hereditary and idiopathic peripheral neuropathy 01/17/2009  . Allergic rhinitis due to pollen 2008  . Hyperlipidemia   . Urinary tract infection, site not specified 2007  . Urethral fistula 2007  . Benign neoplasm of colon 2007  . Anxiety   . Unspecified constipation 2007  . Degeneration of intervertebral disc, site unspecified 2007  . Senile osteoporosis 2007  . Insomnia, unspecified 2007  . Unspecified hypothyroidism   . B12 deficiency   . Unspecified essential hypertension     Past Surgical History  Procedure Laterality Date  . Tonsillectomy and adenoidectomy  1940  . Tubal ligation  1972  . Colonoscopy w/ biopsies and polypectomy  12/1999    adenomatous Dr. Verdia Kuba  . Colonoscopy w/ biopsies  05/10/2008  Dr. Collene Mares more prominent changes in sigmoid colon.     Social History:   reports that she has never smoked. She has never used smokeless tobacco. She reports that she does not drink alcohol or use illicit drugs.  Allergies  Allergen Reactions  . Codeine   . Demerol [Meperidine]   . Sulfa Antibiotics     Medications: Patient's Medications  New Prescriptions   No medications on file  Previous Medications   ASPIRIN EC 325 MG TABLET    Take 1 tablet (325 mg total) by mouth daily.   CHOLECALCIFEROL (VITAMIN D) 2000 UNITS CAPS    Take 1 capsule (2,000 Units  total) by mouth daily.   CYANOCOBALAMIN 500 MCG/0.1ML SOLN    Place into the nose. One spray intranasally every 2 weeks.   DONEPEZIL (ARICEPT) 23 MG TABS TABLET    TAKE 1 TABLET ONCE DAILY FOR MEMORY.   NAMENDA XR 28 MG CP24 24 HR CAPSULE    TAKE (1) CAPSULE DAILY.   PSYLLIUM (METAMUCIL PO)    Take by mouth daily.   SYNTHROID 88 MCG TABLET    TAKE (1) TABLET DAILY FOR THYROID.  Modified Medications   No medications on file  Discontinued Medications   No medications on file     Physical Exam: Filed Vitals:   04/25/15 1351  BP: 100/64  Pulse: 80  Temp: 97.5 F (36.4 C)  TempSrc: Oral  Weight: 128 lb (58.06 kg)  SpO2: 94%   Body mass index is 20.97 kg/(m^2). Physical Exam  Constitutional: She appears well-developed and well-nourished. No distress.  HENT:  Head: Normocephalic and atraumatic.  Hearing aides  Eyes:  glasses  Cardiovascular:  irreg irreg  Pulmonary/Chest: Effort normal and breath sounds normal. No respiratory distress.  Abdominal: Soft. Bowel sounds are normal.  Musculoskeletal: She exhibits no edema or tenderness.  Must use both arms to get up out of chair, shuffling gait, walks holding her husband's hand  Neurological: She is alert.  Oriented to person, place; no cogwheeling rigidity or tremors  Skin: Skin is warm and dry.  Psychiatric:  Flat affect     Labs reviewed: Basic Metabolic Panel:  Recent Labs  08/31/14 10/10/14 01/16/15  NA  --  142 140  K  --  4.0 4.1  BUN  --  20 17  CREATININE  --  0.7 0.7  TSH 3.61 3.05 2.30   Liver Function Tests: No results for input(s): AST, ALT, ALKPHOS, BILITOT, PROT, ALBUMIN in the last 8760 hours. No results for input(s): LIPASE, AMYLASE in the last 8760 hours. No results for input(s): AMMONIA in the last 8760 hours. CBC: No results for input(s): WBC, NEUTROABS, HGB, HCT, MCV, PLT in the last 8760 hours. Lipid Panel:  Recent Labs  10/10/14  CHOL 261*  HDL 86*  LDLCALC 150  TRIG 86   No results  found for: HGBA1C  Procedures since last appt: EKG was done 03/24/15, but I do not have a copy of this  Patient Care Team: Gayland Curry, DO as PCP - General (Geriatric Medicine) Juanita Craver, MD as Consulting Physician (Gastroenterology) Almedia Balls, MD as Consulting Physician (Orthopedic Surgery) [facility]  Assessment/Plan 1. Lethargy -suspect she had bradycardia or another arrhythmia that led to her lethargy and decreased responsiveness on 03/24/15 -I don't have a copy of the EKG, but it was said to show afib which she has had -they refused hospitalization and I dont' recall being asked about her having follow up labs -  a UA was done, but unremarkable apparently b/c there are no more notes in the chart about her since -will do a panel of labs on her cbc, cmp, tsh, but this is a month out  2. Alzheimer's disease -is gradually progressing -she has caregivers plus her husband in the independent environment -cont aricept 23mg  and namenda XR 28mg  daily -HR here and during the acute event said to be wnl so doubt she's had bradycardia from the aricept  3. Atrial fibrillation, unspecified -rate has been controlled without meds -continues on full dose asa without complications  4. Hypothyroidism, unspecified hypothyroidism type -TSH last check was normal, cont synthroid 55mcg daily and f/u TSH next week  5. B12 deficiency -cont b12 oral supplement  -last level was over 1000  6. Slow transit constipation -ongoing, but seems to respond to metamucil and hydration  Labs/tests ordered:  Cbc,cmp, tsh next week Next appt:  3 mos.  Johnhenry Tippin L. Dajanae Brophy, D.O. [facility] [address], [place] [address] Cell Phone (Mon-Fri 8am-5pm):  [phone] On Call:  [phone] & follow prompts after 5pm & weekends Office Phone:  [phone] Office Fax:  [phone]

## 2015-05-03 LAB — HEPATIC FUNCTION PANEL
ALT: 11 U/L (ref 7–35)
AST: 14 U/L (ref 13–35)
Alkaline Phosphatase: 46 U/L (ref 25–125)
BILIRUBIN, TOTAL: 0.8 mg/dL

## 2015-05-03 LAB — CBC AND DIFFERENTIAL
HCT: 41 % (ref 36–46)
Hemoglobin: 13.4 g/dL (ref 12.0–16.0)
PLATELETS: 187 10*3/uL (ref 150–399)
WBC: 3.9 10*3/mL

## 2015-05-03 LAB — TSH: TSH: 2.74 u[IU]/mL (ref 0.41–5.90)

## 2015-05-03 LAB — BASIC METABOLIC PANEL
BUN: 17 mg/dL (ref 4–21)
Creatinine: 0.8 mg/dL (ref 0.5–1.1)
Glucose: 95 mg/dL
Potassium: 4.1 mmol/L (ref 3.4–5.3)
Sodium: 142 mmol/L (ref 137–147)

## 2015-05-04 ENCOUNTER — Other Ambulatory Visit: Payer: Self-pay

## 2015-05-09 ENCOUNTER — Telehealth: Payer: Self-pay

## 2015-05-09 NOTE — Telephone Encounter (Signed)
Spoke with husband, labs were all good.

## 2015-06-05 ENCOUNTER — Ambulatory Visit: Payer: Medicare Other | Admitting: Neurology

## 2015-06-28 ENCOUNTER — Other Ambulatory Visit: Payer: Self-pay | Admitting: Internal Medicine

## 2015-07-02 ENCOUNTER — Ambulatory Visit (INDEPENDENT_AMBULATORY_CARE_PROVIDER_SITE_OTHER): Payer: Medicare Other | Admitting: Neurology

## 2015-07-02 ENCOUNTER — Encounter: Payer: Self-pay | Admitting: Neurology

## 2015-07-02 VITALS — BP 120/62 | HR 88 | Resp 20 | Ht 66.0 in | Wt 129.0 lb

## 2015-07-02 DIAGNOSIS — F0390 Unspecified dementia without behavioral disturbance: Secondary | ICD-10-CM

## 2015-07-02 DIAGNOSIS — R269 Unspecified abnormalities of gait and mobility: Secondary | ICD-10-CM | POA: Insufficient documentation

## 2015-07-02 DIAGNOSIS — R2689 Other abnormalities of gait and mobility: Secondary | ICD-10-CM

## 2015-07-02 DIAGNOSIS — F068 Other specified mental disorders due to known physiological condition: Secondary | ICD-10-CM | POA: Diagnosis not present

## 2015-07-02 DIAGNOSIS — H519 Unspecified disorder of binocular movement: Secondary | ICD-10-CM | POA: Insufficient documentation

## 2015-07-02 MED ORDER — MEMANTINE HCL ER 28 MG PO CP24: 28.0000 mg | ORAL_CAPSULE | Freq: Every day | ORAL | Status: DC

## 2015-07-02 NOTE — Progress Notes (Signed)
Reason for visit : A follow up for memory loss;   Sonya Robertson is an established patient of our practice, has been followed for memory loss. The patient undergoes serial Mini-Mental Status Examination upon visits and seems to have more difficulties to oriented to time and place, date- score today 11/30 points and generated 3 words on animal fluency. She was not able to copy an image. The geriatric depression scale was endorsed at 0 points. She also has more trouble generating words and has actually cut becomes quite sparse with verbal output. She had a PCP visit with labs on 03-14-14 , her lipids are high, all metabolic tests were normal. She remains on B 12  Nasal spray once a week.  She is taking Aricept and Namenda  XR at highest commercial dose without side effects.   In comparison to last visit , the patient scored 11 points , 5 on the animal fluency test last year . She did not copy an image or write a phase. She was not oriented to place or time. The patient is here accompanied by her family , the family  ( husband and daughter ) indicated her sparse word flow, she is unaware or in denial of aphasia, hesitation of speech. Her husband denies finding her depressed and is not reporting hallucinations. The patients only concern is constipation.   Per patient's recollection ; she rises at about 7 AM, wakes up without alarm. Breakfast with cereal and coffee. She walks and runs errands, she works out with a trainer 4 days a week. [facility]. Sonya Robertson is her assistant, 4 days a week for shower and laundry . Monday she has lunch and runs errands with a friend.        Interval history from 10-30 1-16 , [name] returns for followup with her daughter and her husband. She is living at [facility]. Her last visit took place with Cecille Rubin is practitioner here about 6 months ago. Today's Mini-Mental Status Examination score at 4 points out of 30, When I saw the patient last year she scored 11 points  and was able to generate 5 names for animals. At the time she already did not copy an image or write a sentence. I asked Sonya Robertson if she suffers from any hallucinations seeing or hearing things that other people in the room cannot see or hear but she denies having such experiences. Her husband agrees.  She has a good appetite . She is dependent on assistance, The couple eats breakfast and lunch usually in their own quarters but take supper in the common room. The patient fell twice in the last 12 months 1 at the end of December 2015 , one time the patient reported having fallen but this was not witnessed by family members. She was poorly responsive and  EMD evaluated her, asked her if she wanted to go to hospital and she declined.   MRI of the brain with mild atrophy and SVD.  EEG  slowing.    Cardiac arrhythmia. Atrial fib.  Also with  B 12 deficiency and takes the nasal spray.  ROS:  Weight loss, ringing in the ears, allergies, anxiety, memory loss   Medications Current Outpatient Prescriptions on File Prior to Visit  Medication Sig Dispense Refill  . aspirin EC 325 MG tablet Take 1 tablet (325 mg total) by mouth daily. 30 tablet 0  . Cholecalciferol (VITAMIN D) 2000 UNITS CAPS Take 1 capsule (2,000 Units total) by mouth daily. (Patient taking  differently: Take 1 capsule by mouth. Take one once a week) 30 capsule 3  . donepezil (ARICEPT) 23 MG TABS tablet TAKE 1 TABLET ONCE DAILY FOR MEMORY. 90 tablet 0  . NAMENDA XR 28 MG CP24 24 hr capsule TAKE (1) CAPSULE DAILY. 90 capsule 1  . Psyllium (METAMUCIL PO) Take by mouth daily.    Marland Kitchen SYNTHROID 88 MCG tablet TAKE (1) TABLET DAILY FOR THYROID. 30 tablet 3  . vitamin B-12 (CYANOCOBALAMIN) 500 MCG tablet Take 500 mcg by mouth. Take one tablet daily for Vitamin B12     No current facility-administered medications on file prior to visit.    Allergies  Allergies  Allergen Reactions  . Codeine   . Demerol [Meperidine]   . Sulfa Antibiotics      Physical Exam General: well developed, frail female  seated, in no evident distress, the patient is slender , well groomed and friendly.  Head: head normocephalic and atraumatic. Oropharynx benign Neck: supple with no carotid  Bruits. Agent has a slightly hunched posture, forward bend. She walks rigidly, without rotation at the lumbar spine and takes multiple steps to turn. Ascending to a step stool is very difficult for her. Cardiovascular: here, regular rate and rhythm, no murmurs  Neurologic Exam Mental Status: Awake and  alert. MMSE down from 12/30 to 4 -30. Follows not longer commands. Speech and language normal  - she has hearing aids.  Cranial Nerves: Pupils equal, briskly reactive to light. n bilateral ptosis noted, Extraocular movements  without nystagmus. The patient had trouble to trace a moving object with both eyes towards the left and had also difficulties with upward gaze. To the right and downwards seemed to have been easier for her.  Right ptosis pre-existing,  Hearing intact with aids , Tinnitus,  and symmetric to finger snap.  Facial sensation intact. Face, tongue, palate move normally and symmetrically. Neck flexion and extension normal.  Motor: Normal bulk and tone. Normal strength in all tested extremity muscles.No focal weakness Sensory.: intact to touch and pinprick and vibratory. The patient shows no signs of extinction or neglect.  She did not verbally name the left or right side in orienting questions , but pointed to the side affected by fine touch testing. Coordination: Rapid alternating movements normal in all extremities. Finger-to-nose and heel-to-shin performed accurately bilaterally. No dysmetria. Mild resting resting tremor.  Gait and Station: Arises from chair with bracing . Small and narrow steps, a slight bit shuffling.  No assistive device needed, in the apartment, she uses a walker outdoors.  Reflexes: 2+ and symmetric. Toes downgoing.       ASSESSMENT: 45 minute Rv with more than 50% of the face to face time dedicated to address the new physical symptoms and changes, the gait changes, posture, shuffling , MMSE decline and eye movement restriction.     The patient has a significant progression in her cognitive abilities. Today score was 7 out of 30 in the Mini-Mental test. Her gait has changed she is shuffling she appears rigid at the trunk. She also needs multiple small steps to turn and she needed assistance to get up to the exam table. About a year or 18 months ago this was not the case. I noted that her eye movements are normal longer following and objects mostly and I'm concerned that besides a dementia we are also dealing with progressive supranuclear palsy. No dysphagia. Denies hallucinations. Masked face but no tremor. NO bulbar affect changes.  Dementia currently on Aricept 23  mg daily without side effects. Namenda XR 28 mg.    PLAN: Pt will continue medication at current dose, will refill Continue exercise for overall good health- I like for her to have group therapy at [facility] . I am concerned about PSP . treatment with amitriptyline is counterproductive in Aricept therapy.  F/U in 3 months, next visit with NP Hassell Done, in 3 month.   Cloyce Blankenhorn, MD      CC Dr Mariea Clonts,

## 2015-07-02 NOTE — Patient Instructions (Signed)
Fall Prevention in the Home   Falls can cause injuries. They can happen to people of all ages. There are many things you can do to make your home safe and to help prevent falls.   WHAT CAN I DO ON THE OUTSIDE OF MY HOME?  · Regularly fix the edges of walkways and driveways and fix any cracks.  · Remove anything that might make you trip as you walk through a door, such as a raised step or threshold.  · Trim any bushes or trees on the path to your home.  · Use bright outdoor lighting.  · Clear any walking paths of anything that might make someone trip, such as rocks or tools.  · Regularly check to see if handrails are loose or broken. Make sure that both sides of any steps have handrails.  · Any raised decks and porches should have guardrails on the edges.  · Have any leaves, snow, or ice cleared regularly.  · Use sand or salt on walking paths during winter.  · Clean up any spills in your garage right away. This includes oil or grease spills.  WHAT CAN I DO IN THE BATHROOM?   · Use night lights.  · Install grab bars by the toilet and in the tub and shower. Do not use towel bars as grab bars.  · Use non-skid mats or decals in the tub or shower.  · If you need to sit down in the shower, use a plastic, non-slip stool.  · Keep the floor dry. Clean up any water that spills on the floor as soon as it happens.  · Remove soap buildup in the tub or shower regularly.  · Attach bath mats securely with double-sided non-slip rug tape.  · Do not have throw rugs and other things on the floor that can make you trip.  WHAT CAN I DO IN THE BEDROOM?  · Use night lights.  · Make sure that you have a light by your bed that is easy to reach.  · Do not use any sheets or blankets that are too big for your bed. They should not hang down onto the floor.  · Have a firm chair that has side arms. You can use this for support while you get dressed.  · Do not have throw rugs and other things on the floor that can make you trip.  WHAT CAN I DO IN  THE KITCHEN?  · Clean up any spills right away.  · Avoid walking on wet floors.  · Keep items that you use a lot in easy-to-reach places.  · If you need to reach something above you, use a strong step stool that has a grab bar.  · Keep electrical cords out of the way.  · Do not use floor polish or wax that makes floors slippery. If you must use wax, use non-skid floor wax.  · Do not have throw rugs and other things on the floor that can make you trip.  WHAT CAN I DO WITH MY STAIRS?  · Do not leave any items on the stairs.  · Make sure that there are handrails on both sides of the stairs and use them. Fix handrails that are broken or loose. Make sure that handrails are as long as the stairways.  · Check any carpeting to make sure that it is firmly attached to the stairs. Fix any carpet that is loose or worn.  · Avoid having throw rugs at the top   or bottom of the stairs. If you do have throw rugs, attach them to the floor with carpet tape.  · Make sure that you have a light switch at the top of the stairs and the bottom of the stairs. If you do not have them, ask someone to add them for you.  WHAT ELSE CAN I DO TO HELP PREVENT FALLS?  · Wear shoes that:    Do not have high heels.    Have rubber bottoms.    Are comfortable and fit you well.    Are closed at the toe. Do not wear sandals.  · If you use a stepladder:    Make sure that it is fully opened. Do not climb a closed stepladder.    Make sure that both sides of the stepladder are locked into place.    Ask someone to hold it for you, if possible.  · Clearly mark and make sure that you can see:    Any grab bars or handrails.    First and last steps.    Where the edge of each step is.  · Use tools that help you move around (mobility aids) if they are needed. These include:    Canes.    Walkers.    Scooters.    Crutches.  · Turn on the lights when you go into a dark area. Replace any light bulbs as soon as they burn out.  · Set up your furniture so you have a clear  path. Avoid moving your furniture around.  · If any of your floors are uneven, fix them.  · If there are any pets around you, be aware of where they are.  · Review your medicines with your doctor. Some medicines can make you feel dizzy. This can increase your chance of falling.  Ask your doctor what other things that you can do to help prevent falls.     This information is not intended to replace advice given to you by your health care provider. Make sure you discuss any questions you have with your health care provider.     Document Released: 06/14/2009 Document Revised: 01/02/2015 Document Reviewed: 09/22/2014  Elsevier Interactive Patient Education ©2016 [employer]

## 2015-07-15 ENCOUNTER — Emergency Department (HOSPITAL_COMMUNITY)
Admission: EM | Admit: 2015-07-15 | Discharge: 2015-07-15 | Disposition: A | Discharge status: Home / Self Care | Payer: Medicare Other | Attending: Emergency Medicine | Admitting: Emergency Medicine

## 2015-07-15 ENCOUNTER — Emergency Department (HOSPITAL_COMMUNITY): Payer: Medicare Other

## 2015-07-15 ENCOUNTER — Encounter (HOSPITAL_COMMUNITY): Payer: Self-pay | Admitting: Emergency Medicine

## 2015-07-15 DIAGNOSIS — Z87448 Personal history of other diseases of urinary system: Secondary | ICD-10-CM | POA: Insufficient documentation

## 2015-07-15 DIAGNOSIS — F0280 Dementia in other diseases classified elsewhere without behavioral disturbance: Secondary | ICD-10-CM | POA: Insufficient documentation

## 2015-07-15 DIAGNOSIS — Z87440 Personal history of urinary (tract) infections: Secondary | ICD-10-CM | POA: Insufficient documentation

## 2015-07-15 DIAGNOSIS — Y92128 Other place in nursing home as the place of occurrence of the external cause: Secondary | ICD-10-CM | POA: Insufficient documentation

## 2015-07-15 DIAGNOSIS — Z86018 Personal history of other benign neoplasm: Secondary | ICD-10-CM | POA: Insufficient documentation

## 2015-07-15 DIAGNOSIS — E039 Hypothyroidism, unspecified: Secondary | ICD-10-CM | POA: Insufficient documentation

## 2015-07-15 DIAGNOSIS — M1990 Unspecified osteoarthritis, unspecified site: Secondary | ICD-10-CM | POA: Insufficient documentation

## 2015-07-15 DIAGNOSIS — Y998 Other external cause status: Secondary | ICD-10-CM | POA: Insufficient documentation

## 2015-07-15 DIAGNOSIS — I10 Essential (primary) hypertension: Secondary | ICD-10-CM | POA: Diagnosis not present

## 2015-07-15 DIAGNOSIS — K5900 Constipation, unspecified: Secondary | ICD-10-CM | POA: Diagnosis not present

## 2015-07-15 DIAGNOSIS — Y9389 Activity, other specified: Secondary | ICD-10-CM | POA: Diagnosis not present

## 2015-07-15 DIAGNOSIS — Z23 Encounter for immunization: Secondary | ICD-10-CM | POA: Insufficient documentation

## 2015-07-15 DIAGNOSIS — G309 Alzheimer's disease, unspecified: Secondary | ICD-10-CM | POA: Diagnosis not present

## 2015-07-15 DIAGNOSIS — S0101XA Laceration without foreign body of scalp, initial encounter: Secondary | ICD-10-CM

## 2015-07-15 DIAGNOSIS — Z7982 Long term (current) use of aspirin: Secondary | ICD-10-CM | POA: Insufficient documentation

## 2015-07-15 DIAGNOSIS — E538 Deficiency of other specified B group vitamins: Secondary | ICD-10-CM | POA: Insufficient documentation

## 2015-07-15 DIAGNOSIS — W19XXXA Unspecified fall, initial encounter: Secondary | ICD-10-CM

## 2015-07-15 DIAGNOSIS — Z79899 Other long term (current) drug therapy: Secondary | ICD-10-CM | POA: Insufficient documentation

## 2015-07-15 DIAGNOSIS — W1839XA Other fall on same level, initial encounter: Secondary | ICD-10-CM | POA: Insufficient documentation

## 2015-07-15 LAB — CBC WITH DIFFERENTIAL/PLATELET
Basophils Absolute: 0 10*3/uL (ref 0.0–0.1)
Basophils Relative: 0 %
Eosinophils Absolute: 0 10*3/uL (ref 0.0–0.7)
Eosinophils Relative: 0 %
HEMATOCRIT: 41.6 % (ref 36.0–46.0)
HEMOGLOBIN: 13.7 g/dL (ref 12.0–15.0)
Lymphocytes Relative: 15 %
Lymphs Abs: 1 10*3/uL (ref 0.7–4.0)
MCH: 29.4 pg (ref 26.0–34.0)
MCHC: 32.9 g/dL (ref 30.0–36.0)
MCV: 89.3 fL (ref 78.0–100.0)
MONOS PCT: 7 %
Monocytes Absolute: 0.4 10*3/uL (ref 0.1–1.0)
Neutro Abs: 5.1 10*3/uL (ref 1.7–7.7)
Neutrophils Relative %: 78 %
Platelets: 171 10*3/uL (ref 150–400)
RBC: 4.66 MIL/uL (ref 3.87–5.11)
RDW: 13.5 % (ref 11.5–15.5)
WBC: 6.6 10*3/uL (ref 4.0–10.5)

## 2015-07-15 LAB — BASIC METABOLIC PANEL
Anion gap: 11 (ref 5–15)
BUN: 17 mg/dL (ref 6–20)
CHLORIDE: 102 mmol/L (ref 101–111)
CO2: 27 mmol/L (ref 22–32)
CREATININE: 0.79 mg/dL (ref 0.44–1.00)
Calcium: 9.5 mg/dL (ref 8.9–10.3)
GFR calc Af Amer: >60 mL/min (ref >60)
GFR calc non Af Amer: >60 mL/min (ref >60)
Glucose, Bld: 131 mg/dL — ABNORMAL HIGH (ref 65–99)
POTASSIUM: 3.8 mmol/L (ref 3.5–5.1)
Sodium: 140 mmol/L (ref 135–145)

## 2015-07-15 MED ORDER — BACITRACIN ZINC 500 UNIT/GM EX OINT
TOPICAL_OINTMENT | CUTANEOUS | Status: AC
  Administered 2015-07-15: 20:00:00
  Filled 2015-07-15: qty 0.9

## 2015-07-15 MED ORDER — LIDOCAINE-EPINEPHRINE 2 %-1:100000 IJ SOLN
INTRAMUSCULAR | Status: AC
  Filled 2015-07-15: qty 1

## 2015-07-15 MED ORDER — LIDOCAINE-EPINEPHRINE (PF) 2 %-1:200000 IJ SOLN
10.0000 mL | Freq: Once | INTRAMUSCULAR | Status: AC
  Administered 2015-07-15: 10 mL

## 2015-07-15 MED ORDER — LIDOCAINE-EPINEPHRINE (PF) 1 %-1:200000 IJ SOLN
INTRAMUSCULAR | Status: DC
Start: 2015-07-15 — End: 2015-07-16
  Filled 2015-07-15: qty 30

## 2015-07-15 MED ORDER — LIDOCAINE-EPINEPHRINE (PF) 2 %-1:200000 IJ SOLN
INTRAMUSCULAR | Status: AC
  Administered 2015-07-15: 20:00:00
  Filled 2015-07-15: qty 20

## 2015-07-15 MED ORDER — TETANUS-DIPHTH-ACELL PERTUSSIS 5-2.5-18.5 LF-MCG/0.5 IM SUSP
0.5000 mL | Freq: Once | INTRAMUSCULAR | Status: AC
  Administered 2015-07-15: 0.5 mL via INTRAMUSCULAR
  Filled 2015-07-15: qty 0.5

## 2015-07-15 NOTE — ED Notes (Signed)
[facility] here to transport pt back to [facility]. 

## 2015-07-15 NOTE — ED Notes (Signed)
Pt arrived via EMS from [facility] with posterior head abrasion and small lac with controlled bleeding s/p to unwitnessed fall. Facility reported hearing pt when she fell and responded immediately.

## 2015-07-15 NOTE — ED Notes (Signed)
[facility] was called for pt's transportation. 

## 2015-07-15 NOTE — ED Notes (Signed)
Pt taken to CT scan.

## 2015-07-15 NOTE — ED Notes (Signed)
Bed: [id number] Expected date: 07/15/15 Expected time: 5:50 PM Means of arrival: Ambulance Comments: fall

## 2015-07-15 NOTE — ED Notes (Signed)
Staff at [facility] was called and report on pt and discharge instructions provided provided.

## 2015-07-15 NOTE — ED Provider Notes (Signed)
CSN: [id number]     Arrival date & time 07/15/15  1754 History   First MD Initiated Contact with Patient 07/15/15 1809     Chief Complaint  Patient presents with  . Fall     (Consider location/radiation/quality/duration/timing/severity/associated sxs/prior Treatment) HPI  81 year old female with a history of Alzheimer's presents after a fall. The nursing staff tells me that she was ambulating with her walker in the memory care unit and had an unwitnessed fall in the hallway. Was found laying on the ground and was awake and alert. No evidence that she passed out. Patient does not remember falling. Patient has an unsteady gait per nurse at baseline. Patient occasionally tries to sit backwards on a chair even when no chair is behind her per the facility.Patient does not follow commands and will not answer questions for me.   Past Medical History  Diagnosis Date  . Arthritis   . Alzheimer's disease   . Unspecified hereditary and idiopathic peripheral neuropathy 01/17/2009  . Allergic rhinitis due to pollen 2008  . Hyperlipidemia   . Urinary tract infection, site not specified 2007  . Urethral fistula 2007  . Benign neoplasm of colon 2007  . Anxiety   . Unspecified constipation 2007  . Degeneration of intervertebral disc, site unspecified 2007  . Senile osteoporosis 2007  . Insomnia, unspecified 2007  . Unspecified hypothyroidism   . B12 deficiency   . Unspecified essential hypertension    Past Surgical History  Procedure Laterality Date  . Tonsillectomy and adenoidectomy  1940  . Tubal ligation  1972  . Colonoscopy w/ biopsies and polypectomy  12/1999    adenomatous Dr. Verdia Kuba  . Colonoscopy w/ biopsies  05/10/2008    Dr. Collene Mares more prominent changes in sigmoid colon.    Family History  Problem Relation Age of Onset  . Cancer Mother     COLON  . Heart disease Father     MI   Social History  Substance Use Topics  . Smoking status: Never Smoker   . Smokeless tobacco: Never  Used  . Alcohol Use: No   OB History    No data available     Review of Systems  Unable to perform ROS: Dementia      Allergies  Codeine; Demerol; and Sulfa antibiotics  Home Medications   Prior to Admission medications   Medication Sig Start Date End Date Taking? Authorizing Provider  aspirin EC 325 MG tablet Take 1 tablet (325 mg total) by mouth daily. 08/29/14   Tiffany L Reed, DO  Cholecalciferol (VITAMIN D) 2000 UNITS CAPS Take 1 capsule (2,000 Units total) by mouth daily. Patient taking differently: Take 1 capsule by mouth. Take one once a week 10/17/14   Tiffany L Reed, DO  donepezil (ARICEPT) 23 MG TABS tablet TAKE 1 TABLET ONCE DAILY FOR MEMORY. 04/20/15   Asencion Partridge Dohmeier, MD  memantine (NAMENDA XR) 28 MG CP24 24 hr capsule Take 1 capsule (28 mg total) by mouth daily. 07/02/15   Asencion Partridge Dohmeier, MD  Psyllium (METAMUCIL PO) Take by mouth daily.    Historical Provider, MD  SYNTHROID 88 MCG tablet TAKE (1) TABLET DAILY FOR THYROID. 06/28/15   Tiffany L Reed, DO  vitamin B-12 (CYANOCOBALAMIN) 500 MCG tablet Take 500 mcg by mouth. Take one tablet daily for Vitamin B12    Historical Provider, MD   BP 143/78 mmHg  Pulse 73  Temp(Src) 97.5 F (36.4 C) (Oral)  Resp 18  SpO2 97% Physical Exam  Constitutional:  She appears well-developed and well-nourished.  HENT:  Head: Normocephalic. Head is with laceration.    Right Ear: External ear normal.  Left Ear: External ear normal.  Nose: Nose normal.  Eyes: Pupils are equal, round, and reactive to light. Right eye exhibits no discharge. Left eye exhibits no discharge.  Neck: No spinous process tenderness and no muscular tenderness present.  Cardiovascular: Normal rate, regular rhythm and normal heart sounds.   Pulmonary/Chest: Effort normal and breath sounds normal.  Abdominal: Soft. There is no tenderness.  Neurological: She is alert. She is disoriented.  Moves all 4 extremities but does not follow commands. Awake but  disoriented  Skin: Skin is warm and dry.  Nursing note and vitals reviewed.   ED Course  .Marland KitchenLaceration Repair Date/Time: 07/15/2015 8:17 PM Performed by: Sherwood Gambler Authorized by: Sherwood Gambler Consent: Verbal consent obtained. Consent given by: spouse Body area: head/neck Location details: scalp Laceration length: 4 cm Foreign bodies: no foreign bodies Tendon involvement: none Nerve involvement: none Vascular damage: no Anesthesia: local infiltration Local anesthetic: lidocaine 2% with epinephrine Patient sedated: no Preparation: Patient was prepped and draped in the usual sterile fashion. Irrigation solution: saline Irrigation method: syringe Amount of cleaning: standard Debridement: none Degree of undermining: none Skin closure: staples Number of sutures: 4 Technique: simple Approximation: close Approximation difficulty: simple Dressing: 4x4 sterile gauze and antibiotic ointment Patient tolerance: Patient tolerated the procedure well with no immediate complications   (including critical care time) Labs Review Labs Reviewed  BASIC METABOLIC PANEL - Abnormal; Notable for the following:    Glucose, Bld 131 (*)    All other components within normal limits  CBC WITH DIFFERENTIAL/PLATELET    Imaging Review Ct Head Wo Contrast  07/15/2015  CLINICAL DATA:  Fall. EXAM: CT HEAD WITHOUT CONTRAST CT CERVICAL SPINE WITHOUT CONTRAST TECHNIQUE: Multidetector CT imaging of the head and cervical spine was performed following the standard protocol without intravenous contrast. Multiplanar CT image reconstructions of the cervical spine were also generated. COMPARISON:  None. FINDINGS: CT HEAD FINDINGS Ventricles and cisterns are within normal. There is mild age related atrophic change as well as chronic ischemic microvascular disease. There is no mass, mass effect, shift of midline structures or acute hemorrhage. No evidence of acute infarction. There is a right high occipital  scalp contusion. No evidence of fracture. CT CERVICAL SPINE FINDINGS Vertebral body alignment and heights are normal. There is moderate spondylosis throughout the cervical spine. There is disc space narrowing at the C4-5, C5-6, C6-7 and C7-T1 levels. Prevertebral soft tissues are within normal. There is uncovertebral joint spurring and facet arthropathy. Atlantoaxial articulation is within normal. Bilateral neural foraminal narrowing at multiple levels due to adjacent bony spurring. There is very subtle irregularity and possible lucency over the right lateral mass of C1 and adjacent right lateral mass of C2 as cannot exclude subtle fractures in this location. IMPRESSION: No acute intracranial findings. Chronic ischemic microvascular disease and mild age related atrophic change. Subtle findings involving the right lateral mass of C1 and C2 as cannot exclude subtle nondisplaced fractures in this location. MRI may be helpful for further evaluation. Moderate spondylosis of the cervical spinal with significant multilevel disc disease and neural foraminal narrowing. Critical Value/emergent results were called by telephone at the time of interpretation on 07/15/2015 at 7:46 pm to Dr. Sherwood Gambler , who verbally acknowledged these results. Electronically Signed   By: Marin Olp M.D.   On: 07/15/2015 19:46   Ct Cervical Spine Wo Contrast  07/15/2015  CLINICAL DATA:  Fall. EXAM: CT HEAD WITHOUT CONTRAST CT CERVICAL SPINE WITHOUT CONTRAST TECHNIQUE: Multidetector CT imaging of the head and cervical spine was performed following the standard protocol without intravenous contrast. Multiplanar CT image reconstructions of the cervical spine were also generated. COMPARISON:  None. FINDINGS: CT HEAD FINDINGS Ventricles and cisterns are within normal. There is mild age related atrophic change as well as chronic ischemic microvascular disease. There is no mass, mass effect, shift of midline structures or acute hemorrhage. No  evidence of acute infarction. There is a right high occipital scalp contusion. No evidence of fracture. CT CERVICAL SPINE FINDINGS Vertebral body alignment and heights are normal. There is moderate spondylosis throughout the cervical spine. There is disc space narrowing at the C4-5, C5-6, C6-7 and C7-T1 levels. Prevertebral soft tissues are within normal. There is uncovertebral joint spurring and facet arthropathy. Atlantoaxial articulation is within normal. Bilateral neural foraminal narrowing at multiple levels due to adjacent bony spurring. There is very subtle irregularity and possible lucency over the right lateral mass of C1 and adjacent right lateral mass of C2 as cannot exclude subtle fractures in this location. IMPRESSION: No acute intracranial findings. Chronic ischemic microvascular disease and mild age related atrophic change. Subtle findings involving the right lateral mass of C1 and C2 as cannot exclude subtle nondisplaced fractures in this location. MRI may be helpful for further evaluation. Moderate spondylosis of the cervical spinal with significant multilevel disc disease and neural foraminal narrowing. Critical Value/emergent results were called by telephone at the time of interpretation on 07/15/2015 at 7:46 pm to Dr. Sherwood Gambler , who verbally acknowledged these results. Electronically Signed   By: Marin Olp M.D.   On: 07/15/2015 19:46   I have personally reviewed and evaluated these images and lab results as part of my medical decision-making.   EKG Interpretation   Date/Time:  Sunday July 15 2015 18:20:46 EST Ventricular Rate:  126 PR Interval:    QRS Duration: 96 QT Interval:  338 QTC Calculation: 489 R Axis:   94 Text Interpretation:  Atrial fibrillation Right axis deviation  Anteroseptal infarct, age indeterminate Borderline repol abnormality,  diffuse leads Baseline wander in lead(s) V5 No old tracing to compare  Confirmed by Essa Wenk  MD, [place] ([id number]) on  07/15/2015 7:41:03 PM      MDM   Final diagnoses:  Scalp laceration, initial encounter  Fall, initial encounter    Patient with fall without loss of consciousness. Patient is demented but not worse than typical. Scalp laceration repaired as above. Patient has no neck tenderness but CT shows questionable non-displaced fracture. Discussed with neurosurgery, Dr Ronnald Ramp, who has viewed images and thinks this is not a fracture, given dementia and CT findings he recommends no further treatment, including no c-collar or MRI. Tetanus updated (unknown last immunization). She has a-fib but also history of mechanical falls. Discussed with husband and daughter who would not want patient admitted for tele-obs and would not do any invasive treatment if afib was the cause of her symptoms. Discussed this at length with family and will discharge home with return precautions.     Sherwood Gambler, MD 07/15/15 [phone]

## 2015-07-15 NOTE — ED Notes (Addendum)
Applied bed alarm to bed d/t pt attempting to get OOB per self and is a high risk for falls. Pt removed EKG leads, BP and pulse ox monitor cords.

## 2015-07-17 ENCOUNTER — Non-Acute Institutional Stay (SKILLED_NURSING_FACILITY): Payer: Medicare Other | Admitting: Internal Medicine

## 2015-07-17 ENCOUNTER — Encounter: Payer: Self-pay | Admitting: Internal Medicine

## 2015-07-17 DIAGNOSIS — I4891 Unspecified atrial fibrillation: Secondary | ICD-10-CM | POA: Diagnosis not present

## 2015-07-17 DIAGNOSIS — K5901 Slow transit constipation: Secondary | ICD-10-CM

## 2015-07-17 DIAGNOSIS — E538 Deficiency of other specified B group vitamins: Secondary | ICD-10-CM

## 2015-07-17 DIAGNOSIS — S0101XD Laceration without foreign body of scalp, subsequent encounter: Secondary | ICD-10-CM | POA: Diagnosis not present

## 2015-07-17 DIAGNOSIS — E785 Hyperlipidemia, unspecified: Secondary | ICD-10-CM | POA: Diagnosis not present

## 2015-07-17 DIAGNOSIS — E559 Vitamin D deficiency, unspecified: Secondary | ICD-10-CM | POA: Diagnosis not present

## 2015-07-17 DIAGNOSIS — E039 Hypothyroidism, unspecified: Secondary | ICD-10-CM | POA: Diagnosis not present

## 2015-07-17 DIAGNOSIS — G231 Progressive supranuclear ophthalmoplegia [Steele-Richardson-Olszewski]: Secondary | ICD-10-CM | POA: Diagnosis not present

## 2015-07-17 NOTE — Progress Notes (Signed)
Patient ID: Sonya Robertson, female   DOB: 1932-11-11, 81 y.o.   MRN: [id number]  Provider:  Jonelle Sidle L. Mariea Clonts, D.O., C.M.D. Location:  [facility] PCP: Aragon Scarantino, DO  Code Status: DNR Goals of Care: Advanced Directive information Does patient have an advance directive?: Yes, Type of Advance Directive: [place];Living will;Out of facility DNR (pink MOST or yellow form), Pre-existing out of facility DNR order (yellow form or pink MOST form): Yellow form placed in chart (order not valid for inpatient use);Pink MOST form placed in chart (order not valid for inpatient use)   Chief Complaint  Patient presents with  . New Admit To SNF    to [facility] memory unit   . Hospitalization Follow-up    patient fell 07/15/15--to ED only, 4 cm scalp laceration, 4 staples    HPI: Patient is a 81 y.o. female seen today for admission to [facility] memory care unit from her independent living apt where she stayed with her husband.  She has been declining fairly rapidly in terms of functional status and memory loss.  She has just seen neurology who felt she like has progressive supranuclear palsy.  Since arrival here on 10/27, she has been adjusting well except for a fall she sustained striking her head on the floor.  She was transported to the ED due to a 4cm laceration to her posterior scalp that required 4 staples.  She had at CT of her head and her neck there (see imaging below) that were reviewed along with the ED notes.  Cbc and bmp were also performed and normal.  Since her fall, she's been more lethargic.  At baseline, she does not speak much and her po intake is not impressive.  Her husband helped her with all of her ADLs and he provided nursing staff with tips about looking after her and this is in her paper chart.    When seen, she admitted to pain in her head.  Staff have noted bruising, swelling and pain in her right knee, but she was unable to attest to this or deny  it during my visit.  She did tell the ST about it and she was given some tylenol.  Review of Systems  Constitutional: Negative for fever and chills.  Respiratory: Negative for shortness of breath.   Cardiovascular: Negative for chest pain.  Gastrointestinal: Positive for constipation. Negative for abdominal pain.       Takes cookie and metamucil in evening  Genitourinary:       Urinary incontinence  Musculoskeletal: Positive for falls.  Neurological:       Flat affect, shuffling, unsteady gait, uses walker  Psychiatric/Behavioral: Positive for memory loss.    Past Medical History  Diagnosis Date  . Arthritis   . Alzheimer's disease   . Unspecified hereditary and idiopathic peripheral neuropathy 01/17/2009  . Allergic rhinitis due to pollen 2008  . Hyperlipidemia   . Urinary tract infection, site not specified 2007  . Urethral fistula 2007  . Benign neoplasm of colon 2007  . Anxiety   . Unspecified constipation 2007  . Degeneration of intervertebral disc, site unspecified 2007  . Senile osteoporosis 2007  . Insomnia, unspecified 2007  . Unspecified hypothyroidism   . B12 deficiency   . Unspecified essential hypertension    Past Surgical History  Procedure Laterality Date  . Tonsillectomy and adenoidectomy  1940  . Tubal ligation  1972  . Colonoscopy w/ biopsies and polypectomy  12/1999  adenomatous Dr. Verdia Kuba  . Colonoscopy w/ biopsies  05/10/2008    Dr. Collene Mares more prominent changes in sigmoid colon.     reports that she has never smoked. She has never used smokeless tobacco. She reports that she does not drink alcohol or use illicit drugs. Family History  Problem Relation Age of Onset  . Cancer Mother     COLON  . Heart disease Father     MI    Allergies  Allergen Reactions  . Codeine     Reaction: unknown   . Demerol [Meperidine]     Reaction: unknown   . Sulfa Antibiotics     Reaction: unknown       Medication List       This list is accurate as of:  07/17/15  7:27 PM.  Always use your most recent med list.               aspirin 81 MG tablet  Take 81 mg by mouth daily.     aspirin EC 325 MG tablet  Take 1 tablet (325 mg total) by mouth daily.     donepezil 23 MG Tabs tablet  Commonly known as:  ARICEPT  TAKE 1 TABLET ONCE DAILY FOR MEMORY.     memantine 28 MG Cp24 24 hr capsule  Commonly known as:  NAMENDA XR  Take 1 capsule (28 mg total) by mouth daily.     METAMUCIL PO  Take 1 each by mouth daily.     SYNTHROID 88 MCG tablet  Generic drug:  levothyroxine  TAKE (1) TABLET DAILY FOR THYROID.     vitamin B-12 500 MCG tablet  Commonly known as:  CYANOCOBALAMIN  Take 500 mcg by mouth daily. Take one tablet daily for Vitamin B12     Vitamin D 2000 UNITS Caps  Take 1 capsule (2,000 Units total) by mouth daily.        Physical Exam: Filed Vitals:   07/17/15 1427  BP: 134/88  Pulse: 99  Temp: 96 F (35.6 C)  Resp: 18  Height: 5\' 6"  (1.676 m)  Weight: 129 lb (58.514 kg)  SpO2: 97%   Body mass index is 20.83 kg/(m^2). Physical Exam  Constitutional: No distress.  Thin white female seated in recliner chair  Cardiovascular: Normal rate, regular rhythm, normal heart sounds and intact distal pulses.   Pulmonary/Chest: Effort normal and breath sounds normal.  Abdominal: Soft. Bowel sounds are normal.  Musculoskeletal:  No notable tenderness of right knee, but swelling and ecchymoses present, I was able to bend it and she did not show signs of pain  Neurological:  Flat affect, shuffling gait with walker  Skin:  4cm laceration with 4 staples in place over posterior scalp, tender to touch  Psychiatric:  Flat affect    Labs reviewed: Basic Metabolic Panel:  Recent Labs  01/16/15 05/03/15 07/15/15 1852  NA 140 142 140  K 4.1 4.1 3.8  CL  --   --  102  CO2  --   --  27  GLUCOSE  --   --  131*  BUN 17 17 17   CREATININE 0.7 0.8 0.79  CALCIUM  --   --  9.5   Liver Function Tests:  Recent Labs  05/03/15   AST 14  ALT 11  ALKPHOS 46   No results for input(s): LIPASE, AMYLASE in the last 8760 hours. No results for input(s): AMMONIA in the last 8760 hours. CBC:  Recent Labs  05/03/15 07/15/15  1852  WBC 3.9 6.6  NEUTROABS  --  5.1  HGB 13.4 13.7  HCT 41 41.6  MCV  --  89.3  PLT 187 171   Cardiac Enzymes: No results for input(s): CKTOTAL, CKMB, CKMBINDEX, TROPONINI in the last 8760 hours. BNP: Invalid input(s): POCBNP No results found for: HGBA1C Lab Results  Component Value Date   TSH 2.74 05/03/2015   No results found for: VITAMINB12 No results found for: FOLATE No results found for: IRON, TIBC, FERRITIN  Imaging and Procedures obtained prior to memory care admission: CT head/cspine 07/15/15:  No acute intracranial findings. Chronic ischemic microvascular disease and mild age related atrophic change.Subtle findings involving the right lateral mass of C1 and C2 as cannot exclude subtle nondisplaced fractures in this location. MRI may be helpful for further evaluation. Moderate spondylosis of the cervical spinal with significant multilevel disc disease and neural foraminal narrowing.  Assessment/Plan 1. Progressive supranuclear ophthalmoplegia or palsy ([place]) -suspected due to parkinsonian symptoms, dementia, affect, ocular changes -continues on high dose aricept (limited benefit at this point--will discuss more with her husband to see if he wants this continued)  2. Atrial fibrillation, unspecified -paroxysmal -has h/o multiple falls and major head laceration  -only only asa 325mg  daily  3. Hypothyroidism, unspecified hypothyroidism type -tsh at goal with synthroid 57mcg last check--cont same  4. B12 deficiency -continues on daily 561mcg supplement due to neuropathy  5. Slow transit constipation -cont metamucil in evening  6. Hyperlipidemia -not a good candidate for statin therapy at this time--this treatment would not be consistent with comfort goals at this  time  7. Vitamin D deficiency -cont 2000 units which should be daily  8.  Laceration of scalp, subsequent encounter -has 4 staples and these need to be removed around 11/20  Functional status:  Dependent in ADLs due to dementia and her stiff gait and kyphoscoliosis  Family/ staff Communication: spoke with her husband, [address] nurse and nurse manager  Labs/tests ordered:  Just had labs at ED so no new ordered  Phelan Schadt L. Jacory Kamel, D.O. [facility] [address], [place] [address] Cell Phone (Mon-Fri 8am-5pm):  [phone] On Call:  [phone] & follow prompts after 5pm & weekends Office Phone:  [phone] Office Fax:  [phone]

## 2015-08-01 ENCOUNTER — Encounter: Payer: Medicare Other | Admitting: Internal Medicine

## 2015-08-02 ENCOUNTER — Telehealth: Payer: Self-pay

## 2015-08-02 NOTE — Telephone Encounter (Signed)
Spoke to Raiford Noble, Camera operator at [employer], and advised her that Dr. Brett Fairy agreed with Dr. Cyndi Lennert nurse practitioner that the aricept can be discontinued. Raiford Noble confirmed with me the pt's name and DOB, and confirmed the order to d/c pt's aricept.

## 2015-08-02 NOTE — Telephone Encounter (Signed)
Terese Door, RN Case Mgr-[facility], called and advised me that Dr. Jonelle Sidle Reid's nurse practitioner saw the pt today and wanted to know if Dr. Brett Fairy agreed with discontinuing pt's aricept. At this stage in the pt's dementia, the aricept is no longer needed, per the NP. Pt has lost a lot of weight.  I advised Sil, RN that I would send this message to Dr. Brett Fairy, and that I will call the facility back with Dr. Edwena Felty recommendation.  I am to call Bea, charge RN at [phone].

## 2015-08-02 NOTE — Telephone Encounter (Signed)
Agree with d/c aricept.

## 2015-08-03 ENCOUNTER — Non-Acute Institutional Stay (SKILLED_NURSING_FACILITY): Payer: Medicare Other | Admitting: Adult Health

## 2015-08-03 ENCOUNTER — Encounter: Payer: Self-pay | Admitting: Adult Health

## 2015-08-03 DIAGNOSIS — F329 Major depressive disorder, single episode, unspecified: Secondary | ICD-10-CM | POA: Diagnosis not present

## 2015-08-03 DIAGNOSIS — F0280 Dementia in other diseases classified elsewhere without behavioral disturbance: Secondary | ICD-10-CM

## 2015-08-03 DIAGNOSIS — F068 Other specified mental disorders due to known physiological condition: Secondary | ICD-10-CM

## 2015-08-03 DIAGNOSIS — F0393 Unspecified dementia, unspecified severity, with mood disturbance: Secondary | ICD-10-CM

## 2015-08-03 DIAGNOSIS — F0390 Unspecified dementia without behavioral disturbance: Secondary | ICD-10-CM

## 2015-08-03 NOTE — Progress Notes (Signed)
Patient ID: Sonya Robertson, female   DOB: Jul 20, 1933, 81 y.o.   MRN: [id number]     Nursing Home Location:  [place]   Code Status: DNR  Patient Care Team: Gayland Curry, DO as PCP - General (Geriatric Medicine) Juanita Craver, MD as Consulting Physician (Gastroenterology) Almedia Balls, MD as Consulting Physician (Orthopedic Surgery) [facility] Service: [phone])  Chief Complaint  Patient presents with  . Acute Visit    ?depression    HPI:  81 y.o. female residing at [facility], skilled care section. She has a hx of PSP and recently was admitted to the memory care unit due to her progressive memory loss and functional needs. Since her arrival she has fallen 3 times. She tends to get agitated in the evening and appears anxous and depressed to the staff. She has had some difficulty adjusting and has also been losing weight over time. Her MMSE score was 5/30 with a failed clock. She was not able to perform a depression scale today. She answer's some questions appropriately such as her name and the names of her children, but other questions about her care or how she is feeling were not answered, as she began to confabulate. The staff also reports that she is sleeping frequently and somnolent at times.     Review of Systems:  Review of Systems  Unable to perform ROS: Dementia    Medications: Patient's Medications  New Prescriptions   No medications on file  Previous Medications   ASPIRIN EC 325 MG TABLET    Take 1 tablet (325 mg total) by mouth daily.   CHOLECALCIFEROL (VITAMIN D) 2000 UNITS CAPS    Take 1 capsule (2,000 Units total) by mouth daily.   DONEPEZIL (ARICEPT) 23 MG TABS TABLET    TAKE 1 TABLET ONCE DAILY FOR MEMORY.   MEMANTINE (NAMENDA XR) 28 MG CP24 24 HR CAPSULE    Take 1 capsule (28 mg total) by mouth daily.   PSYLLIUM (METAMUCIL PO)    Take 1 each by mouth daily.    SYNTHROID 88 MCG TABLET     TAKE (1) TABLET DAILY FOR THYROID.   VITAMIN B-12 (CYANOCOBALAMIN) 500 MCG TABLET    Take 500 mcg by mouth daily. Take one tablet daily for Vitamin B12  Modified Medications   No medications on file  Discontinued Medications   No medications on file     Physical Exam:  There were no vitals filed for this visit.  Physical Exam  Constitutional: No distress.  frail  Cardiovascular:  No murmur heard. No edema, irregular rhythm  Pulmonary/Chest: Effort normal and breath sounds normal. No respiratory distress.  Abdominal: Soft. Bowel sounds are normal. She exhibits no distension.  Musculoskeletal:  Severe scoliosis and kyphosis.  Walks with a walker needs some assistance  Neurological: She is alert.  Flat affect, able to follow commands, no focal deficit, difficulty moving eyes in vertical direction  Skin: Skin is warm and dry. She is not diaphoretic.  Psychiatric:  Flat affect    Wt Readings from Last 3 Encounters:  07/17/15 129 lb (58.514 kg)  07/02/15 129 lb (58.514 kg)  04/25/15 128 lb (58.06 kg)     Labs reviewed/Significant Diagnostic Results:  Basic Metabolic Panel:  Recent Labs  01/16/15 05/03/15 07/15/15 1852  NA 140 142 140  K 4.1 4.1 3.8  CL  --   --  102  CO2  --   --  27  GLUCOSE  --   --  131*  BUN 17 17 17   CREATININE 0.7 0.8 0.79  CALCIUM  --   --  9.5   Liver Function Tests:  Recent Labs  05/03/15  AST 14  ALT 11  ALKPHOS 46   No results for input(s): LIPASE, AMYLASE in the last 8760 hours. No results for input(s): AMMONIA in the last 8760 hours. CBC:  Recent Labs  05/03/15 07/15/15 1852  WBC 3.9 6.6  NEUTROABS  --  5.1  HGB 13.4 13.7  HCT 41 41.6  MCV  --  89.3  PLT 187 171   CBG: No results for input(s): GLUCAP in the last 8760 hours. TSH:  Recent Labs  10/10/14 01/16/15 05/03/15  TSH 3.05 2.30 2.74   A1C: No results found for: HGBA1C Lipid Panel:  Recent Labs  10/10/14  CHOL 261*  HDL 86*  LDLCALC 150  TRIG 86         Assessment/Plan  1. Dementia arising in the senium and presenium -resident is losing weight and dementia is advancing.  -aricept discontinued as it is no longer providing benefit and she is losing weight  2. Depression due to dementia -has periods of sadness and anxiety, with difficulty adjusting to environment -due to periods of somnolence will not use remeron but try Zoloft 50 mg qhs    Cindi Carbon, [place] [phone]

## 2015-08-09 ENCOUNTER — Non-Acute Institutional Stay (SKILLED_NURSING_FACILITY): Payer: Medicare Other | Admitting: Adult Health

## 2015-08-09 ENCOUNTER — Encounter: Payer: Self-pay | Admitting: Adult Health

## 2015-08-09 DIAGNOSIS — F329 Major depressive disorder, single episode, unspecified: Secondary | ICD-10-CM | POA: Diagnosis not present

## 2015-08-09 DIAGNOSIS — F0393 Unspecified dementia, unspecified severity, with mood disturbance: Secondary | ICD-10-CM

## 2015-08-09 DIAGNOSIS — R5383 Other fatigue: Secondary | ICD-10-CM

## 2015-08-09 DIAGNOSIS — F0280 Dementia in other diseases classified elsewhere without behavioral disturbance: Secondary | ICD-10-CM | POA: Diagnosis not present

## 2015-08-09 NOTE — Progress Notes (Signed)
Patient ID: Sonya Robertson, female   DOB: 01/16/1933, 81 y.o.   MRN: [id number]     Nursing Home Location:  [place]   Code Status: DNR  Patient Care Team: Gayland Curry, DO as PCP - General (Geriatric Medicine) Juanita Craver, MD as Consulting Physician (Gastroenterology) Almedia Balls, MD as Consulting Physician (Orthopedic Surgery) [facility] Service: SNF [phone])  Chief Complaint  Patient presents with  . Acute Visit    lethargy    HPI:  81 y.o. female residing at [facility], memory care section. She has a hx of progressive supranuclear palsy, recently admitted to the memory care unit due to progressive functional and cognitive losses.  I was asked to see her today for lethargy first noted this morning that progressed throughout the day. She was able to eat 75% of her breakfast. After 1 pm she got in the recliner and went to sleep and was not able to be aroused. She has had periods of somnolence before per the staff but this seems to be worse. Her VS are WNL except her RR was 12 with some 10-20 sec periods of apnea. Her sats are 95%.   She fell and hit her head on 11/13 and suffered a laceration to the scalp. A CT of the head was performed showing microvascular disease but no hemorrhage. There was concern for a possible C1 and C2 fracture but neurosurgery evaluated her and felt that this was not the case and they did not recommend any further treatment.  She was started on Zoloft on 12/2 for questionable depression and periods of anxiety/agitation. The nurse has noticed that her gait has become more unsteady over the course of the past few days.     Review of Systems:  Review of Systems  Unable to perform ROS: Dementia    Medications: Patient's Medications  New Prescriptions   No medications on file  Previous Medications   ASPIRIN EC 325 MG TABLET    Take 1 tablet (325 mg total) by mouth daily.   CHOLECALCIFEROL (VITAMIN D) 2000 UNITS CAPS    Take 1 capsule (2,000 Units total) by mouth daily.   DONEPEZIL (ARICEPT) 23 MG TABS TABLET    TAKE 1 TABLET ONCE DAILY FOR MEMORY.   MEMANTINE (NAMENDA XR) 28 MG CP24 24 HR CAPSULE    Take 1 capsule (28 mg total) by mouth daily.   PSYLLIUM (METAMUCIL PO)    Take 1 each by mouth daily.    SERTRALINE (ZOLOFT) 50 MG TABLET    Take 50 mg by mouth daily.   SYNTHROID 88 MCG TABLET    TAKE (1) TABLET DAILY FOR THYROID.   VITAMIN B-12 (CYANOCOBALAMIN) 500 MCG TABLET    Take 500 mcg by mouth daily. Take one tablet daily for Vitamin B12  Modified Medications   No medications on file  Discontinued Medications   No medications on file     Physical Exam:  Filed Vitals:   08/09/15 1619  BP: 115/70  Pulse: 71  Temp: 97 F (36.1 C)  Resp: 12  SpO2: 95%    Physical Exam  Constitutional: No distress.  HENT:  Head: Normocephalic and atraumatic.  Nose: Nose normal.  Eyes: Conjunctivae are normal. Pupils are equal, round, and reactive to light.  Neck: No JVD present.  Cardiovascular: Normal rate and regular rhythm.   No murmur heard. Pulmonary/Chest: Effort normal and breath sounds normal. No respiratory distress. She has no wheezes.  Mouth breathing with slow breaths. No apnea during my visit  Abdominal: Soft. Bowel sounds are normal.  Musculoskeletal: She exhibits no edema or tenderness.  Neurological: She displays normal reflexes.  Arouses to physical stimuli with a moan and a few words. Not able to follow commands for neuro exam. No obvious focal deficit. Twitching noted to right leg and both arms.   Skin: Skin is warm and dry. She is not diaphoretic.  Psychiatric:  lethargic    Wt Readings from Last 3 Encounters:  07/17/15 129 lb (58.514 kg)  07/02/15 129 lb (58.514 kg)  04/25/15 128 lb (58.06 kg)     Labs reviewed/Significant Diagnostic Results:  Basic Metabolic Panel:  Recent Labs  01/16/15 05/03/15 07/15/15 1852  NA 140 142  140  K 4.1 4.1 3.8  CL  --   --  102  CO2  --   --  27  GLUCOSE  --   --  131*  BUN 17 17 17   CREATININE 0.7 0.8 0.79  CALCIUM  --   --  9.5   Liver Function Tests:  Recent Labs  05/03/15  AST 14  ALT 11  ALKPHOS 46   No results for input(s): LIPASE, AMYLASE in the last 8760 hours. No results for input(s): AMMONIA in the last 8760 hours. CBC:  Recent Labs  05/03/15 07/15/15 1852  WBC 3.9 6.6  NEUTROABS  --  5.1  HGB 13.4 13.7  HCT 41 41.6  MCV  --  89.3  PLT 187 171   CBG: No results for input(s): GLUCAP in the last 8760 hours. TSH:  Recent Labs  10/10/14 01/16/15 05/03/15  TSH 3.05 2.30 2.74   A1C: No results found for: HGBA1C Lipid Panel:  Recent Labs  10/10/14  CHOL 261*  HDL 86*  LDLCALC 150  TRIG 86       Assessment/Plan  1) Lethargy She may be in a postictal state or possibly an acidosis.  I spoke with her husband, Dr. Jeannine Kitten. Due to her progressive functional decline and age, he does not wish to send her to the hospital or pursue aggressive workup.  He reports that she has periods of somnolence the usually resolve. I let him know we would order labs here and look for things that are easily reversible. We will hold the zoloft for now, however, I do not think her symptoms are related.  She is a DNR with medical treatment for reversible issues, that do not cause harm.  2) Depression -hold zoloft    Labs/tests ordered CBC, CMP, UA C and S   Cindi Carbon, [place] [phone]

## 2015-09-03 ENCOUNTER — Encounter: Payer: Self-pay | Admitting: Internal Medicine

## 2015-09-03 ENCOUNTER — Non-Acute Institutional Stay (SKILLED_NURSING_FACILITY): Payer: Medicare Other | Admitting: Internal Medicine

## 2015-09-03 DIAGNOSIS — G231 Progressive supranuclear ophthalmoplegia [Steele-Richardson-Olszewski]: Secondary | ICD-10-CM

## 2015-09-03 DIAGNOSIS — Z515 Encounter for palliative care: Secondary | ICD-10-CM | POA: Diagnosis not present

## 2015-09-03 DIAGNOSIS — R0681 Apnea, not elsewhere classified: Secondary | ICD-10-CM

## 2015-09-03 MED ORDER — ATROPINE SULFATE 1 % OP SOLN
OPHTHALMIC | Status: AC
Start: 2015-09-03 — End: ?

## 2015-09-03 MED ORDER — MORPHINE SULFATE 20 MG/5ML PO SOLN
5.0000 mg | ORAL | Status: AC | PRN
Start: 2015-09-03 — End: ?

## 2015-09-03 NOTE — Progress Notes (Signed)
Patient ID: Sonya Robertson, female   DOB: 19-Feb-1933, 82 y.o.   MRN: [id number]   Location: [facility] Provider: Avilyn Virtue L. Mariea Clonts, D.O., C.M.D.  Code Status: DNR Goals of Care: Advanced Directive information Advanced Directives 07/17/2015  Does patient have an advance directive? Yes  Type of Paramedic of [place];Living will;Out of facility DNR (pink MOST or yellow form)  Does patient want to make changes to advanced directive? -  Copy of advanced directive(s) in chart? Yes  Pre-existing out of facility DNR order (yellow form or pink MOST form) Yellow form placed in chart (order not valid for inpatient use);Pink MOST form placed in chart (order not valid for inpatient use)   Chief Complaint  Patient presents with  . Acute Visit    prolonged periods of apnea, end of life   HPI: Patient is a 82 y.o. female seen in the office today for an acute visit for end of life care. She had been thought for several years to have Alzheimer's disease.  She's been increasingly stiff with decreased ability to walk, shuffling gait, flat affect, and difficulty focusing on things visually.  When last seen by neurology, she was felt to possibly have progressive supranuclear ophthalmoplegia/palsy, a parkinsonian syndrome.  At the beginning of last month after moving to [facility] 11/15, her aricept was discontinued due to lack of ongoing benefit.  She had been minimally interactive, sleeping in the chairs in the central area by the fish tank.  She had three falls in 1 month.  She could not provide much reliable information.  Her lethargy has gradually worsened.  She was put on zoloft 12/2 for depression and anxiety.  In the past two days, her breathing has slowed.  Staff have felt she is actively dying.  Hospice was ordered, but cannot see the patient until tomorrow.  When seen this afternoon, she had a period of apnea nearly 1 minute in duration.  She was tachypneic in the 20s between.   She was unresponsive and otherwise resting comfortably.  Earlier, on call had ordered roxanol  0.63ml (5mg ) for her every 4 hrs as needed, but her apneic episodes and tachypnea were less severe at that time.  Her daughter and her husband were both present and questions were answered.    Review of Systems: history obtained from nursing, pt's husband and daughter, nursing notes Review of Systems  Constitutional: Negative for fever and chills.  HENT: Negative for hearing loss.   Eyes:       Visual loss it seems  Respiratory: Positive for shortness of breath.   Cardiovascular: Negative for chest pain.  Gastrointestinal: Negative for abdominal pain.  Genitourinary: Negative for dysuria.  Musculoskeletal: Positive for falls. Negative for myalgias, back pain, joint pain and neck pain.  Skin: Negative for rash.  Neurological: Negative for dizziness and loss of consciousness.       Unresponsive  Psychiatric/Behavioral: Positive for memory loss.    Past Medical History  Diagnosis Date  . Arthritis   . Alzheimer's disease   . Unspecified hereditary and idiopathic peripheral neuropathy 01/17/2009  . Allergic rhinitis due to pollen 2008  . Hyperlipidemia   . Urinary tract infection, site not specified 2007  . Urethral fistula 2007  . Benign neoplasm of colon 2007  . Anxiety   . Unspecified constipation 2007  . Degeneration of intervertebral disc, site unspecified 2007  . Senile osteoporosis 2007  . Insomnia, unspecified 2007  . Unspecified hypothyroidism   . B12  deficiency   . Unspecified essential hypertension     Past Surgical History  Procedure Laterality Date  . Tonsillectomy and adenoidectomy  1940  . Tubal ligation  1972  . Colonoscopy w/ biopsies and polypectomy  12/1999    adenomatous Dr. Verdia Kuba  . Colonoscopy w/ biopsies  05/10/2008    Dr. Collene Mares more prominent changes in sigmoid colon.     Allergies  Allergen Reactions  . Codeine     Reaction: unknown   . Demerol  [Meperidine]     Reaction: unknown   . Sulfa Antibiotics     Reaction: unknown       Medication List       This list is accurate as of: 09/03/15  5:30 PM.  Always use your most recent med list.               aspirin EC 325 MG tablet  Take 1 tablet (325 mg total) by mouth daily.     memantine 28 MG Cp24 24 hr capsule  Commonly known as:  NAMENDA XR  Take 1 capsule (28 mg total) by mouth daily.     METAMUCIL PO  Take 1 each by mouth daily.     sertraline 50 MG tablet  Commonly known as:  ZOLOFT  Take 50 mg by mouth daily.     SYNTHROID 88 MCG tablet  Generic drug:  levothyroxine  TAKE (1) TABLET DAILY FOR THYROID.     vitamin B-12 500 MCG tablet  Commonly known as:  CYANOCOBALAMIN  Take 500 mcg by mouth daily. Take one tablet daily for Vitamin B12     Vitamin D 2000 units Caps  Take 1 capsule (2,000 Units total) by mouth daily.        Physical Exam: Filed Vitals:   09/03/15 1728  BP: 129/86  Pulse: 98  Temp: 96.8 F (36 C)  Resp: 28  Weight: 125 lb (56.7 kg)  SpO2: 91%   Body mass index is 20.19 kg/(m^2). Physical Exam  Constitutional:  Frail white female unresponsive in bed  Cardiovascular:  Tachy, regular  Pulmonary/Chest: She has no wheezes. She has no rales.  tachypneic periods, then prolonged apneic spells up to almost one minute now  Skin: Skin is dry. There is pallor.    Labs reviewed: Basic Metabolic Panel:  Recent Labs  10/10/14 01/16/15 05/03/15 07/15/15 1852  NA 142 140 142 140  K 4.0 4.1 4.1 3.8  CL  --   --   --  102  CO2  --   --   --  27  GLUCOSE  --   --   --  131*  BUN 20 17 17 17   CREATININE 0.7 0.7 0.8 0.79  CALCIUM  --   --   --  9.5  TSH 3.05 2.30 2.74  --    Liver Function Tests:  Recent Labs  05/03/15  AST 14  ALT 11  ALKPHOS 46   No results for input(s): LIPASE, AMYLASE in the last 8760 hours. No results for input(s): AMMONIA in the last 8760 hours. CBC:  Recent Labs  05/03/15 07/15/15 1852  WBC 3.9  6.6  NEUTROABS  --  5.1  HGB 13.4 13.7  HCT 41 41.6  MCV  --  89.3  PLT 187 171   Lipid Panel:  Recent Labs  10/10/14  CHOL 261*  HDL 86*  LDLCALC 150  TRIG 86   Assessment/Plan 1. Progressive supranuclear ophthalmoplegia or palsy ([place]) -pt has in  the past 2 mos taken a really rapid decline with increased falls, decreased responsiveness -her family is here and requests comfort care, hospice level care -hospice team cannot see pt until tomorrow and she is not expected to survive until then due to her apneas nearly one minute in duration with intermittent tachypnea  2. End of life care -increase morphine to 5mg  po q 2 hrs as needed--this can be increased to every 15 mins if she appears uncomfortable -also added atropine drops 2 q 6 hrs as needed for increased secretions (does not seem to be a major problem at present actually but could worsen as she approaches death) -d/c po meds except the roxanol and atropine drops  3. Recurrent apnea -due to end of life -cont to monitor  Cadynce Garrette L. Derrious Bologna, D.O. [facility] [address], [place] [address] Cell Phone (Mon-Fri 8am-5pm):  [phone] On Call:  [phone] & follow prompts after 5pm & weekends Office Phone:  [phone] Office Fax:  [phone]

## 2015-10-02 ENCOUNTER — Ambulatory Visit: Payer: Medicare Other | Admitting: Nurse Practitioner

## 2015-10-03 DEATH — deceased

## 2017-09-02 IMAGING — CT CT CERVICAL SPINE W/O CM
4 of 8 series · 13 of 33 positions shown, 15 images · non-contrast
Comparison: None.

CLINICAL DATA: Fall.

EXAM:
CT HEAD WITHOUT CONTRAST
CT CERVICAL SPINE WITHOUT CONTRAST
TECHNIQUE: Multidetector CT imaging of the head and cervical spine was
performed following the standard protocol without intravenous
contrast. Multiplanar CT image reconstructions of the cervical spine
were also generated.

[Series 6: c-spine st · axial · 0.28mm/px · z∈[-186,-116]mm · 2 of 87 slices shown, 3 images]
[im 29/87  soft-tissue]
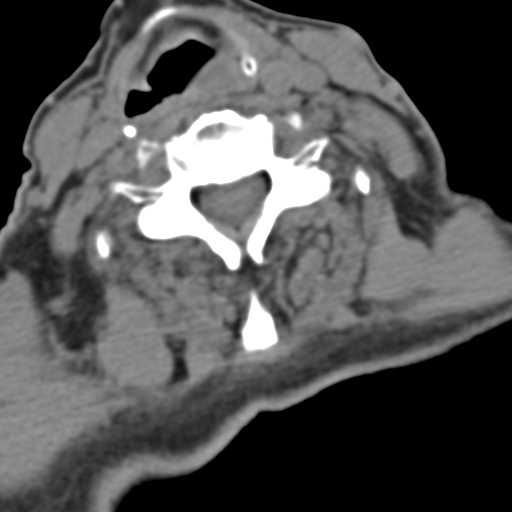
[im 29/87  bone]
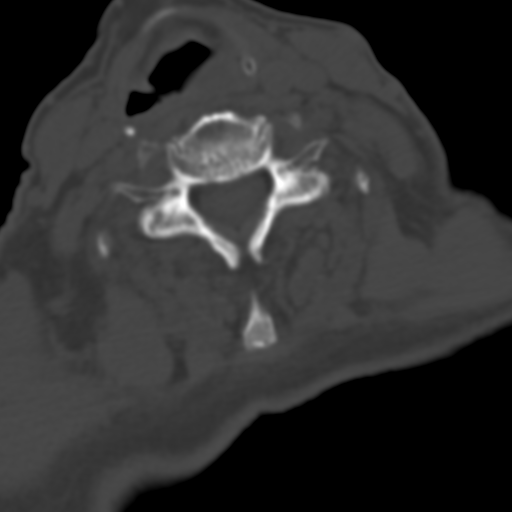
[im 58/87  bone]
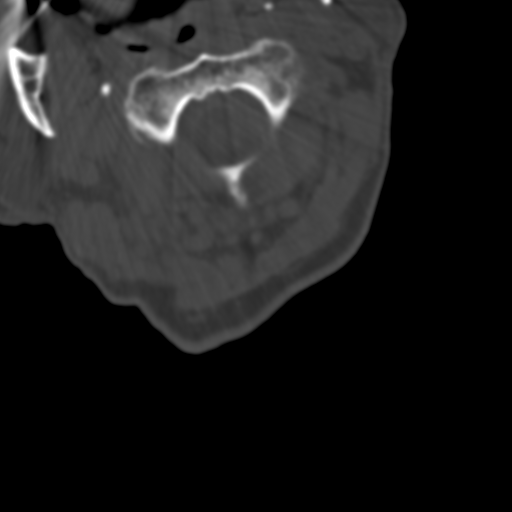

[Series 9: axial recon · axial · 0.23mm/px · z∈[-211,-122]mm · 3 of 93 slices shown]
[im 24/93  bone]
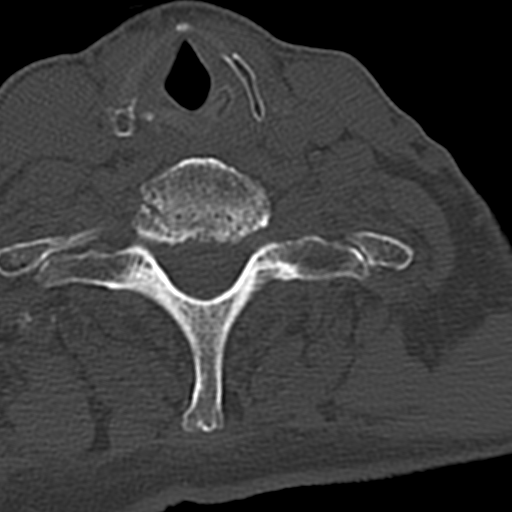
[im 47/93  bone]
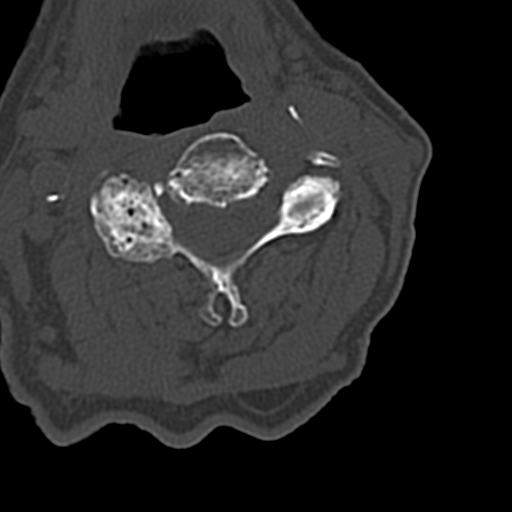
[im 70/93  bone]
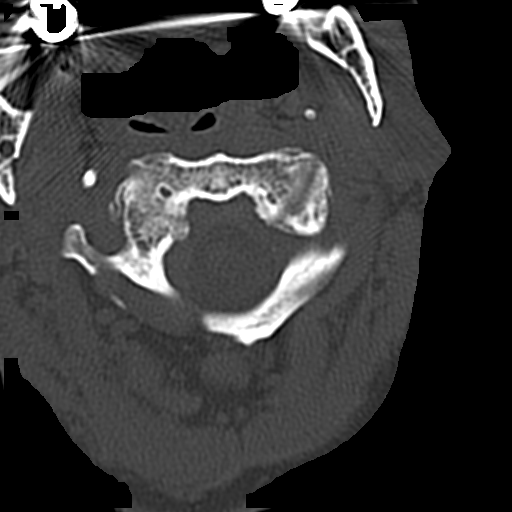

[Series 10: coronal · coronal · 0.27mm/px · 3 of 61 slices shown]
[im 13/61  bone]
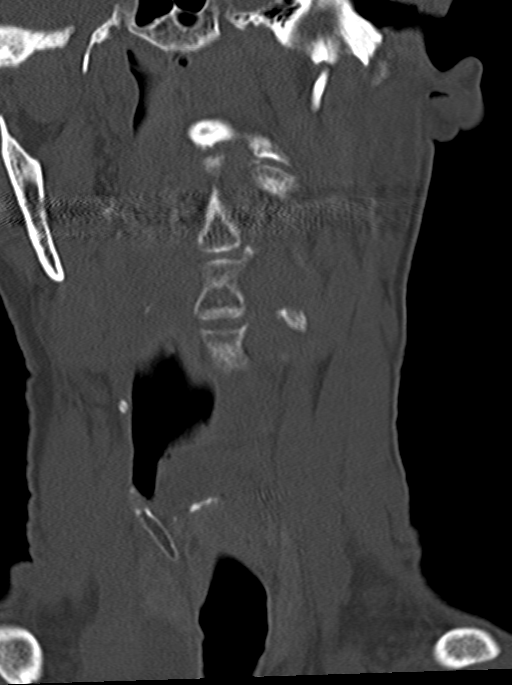
[im 25/61  bone]
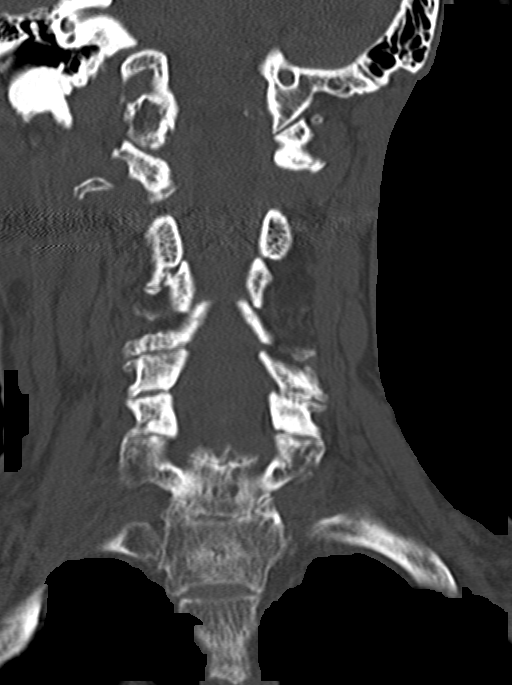
[im 37/61  bone]
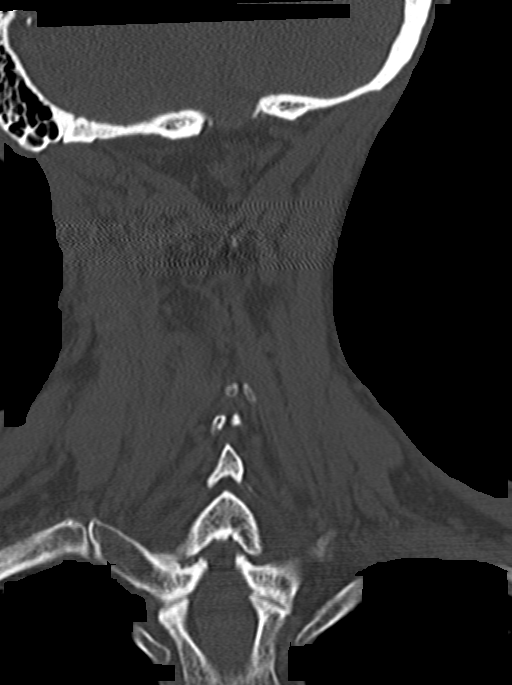

[Series 11: sagittal · sagittal · 0.27mm/px · 5 of 61 slices shown, 6 images]
[im 21/61  bone]
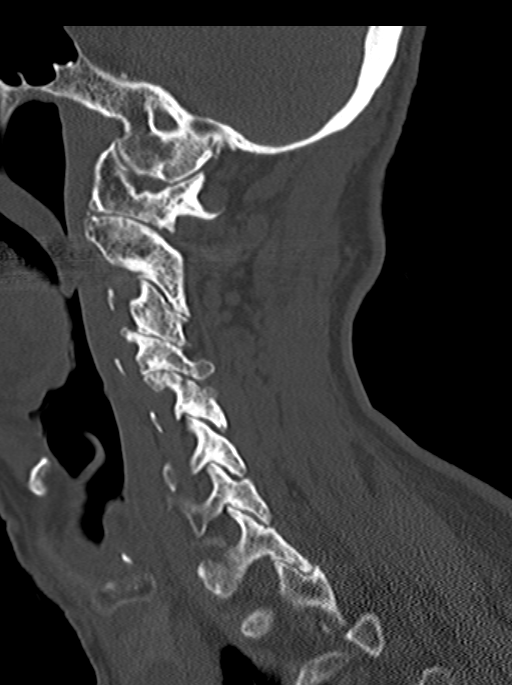
[im 26/61  bone]
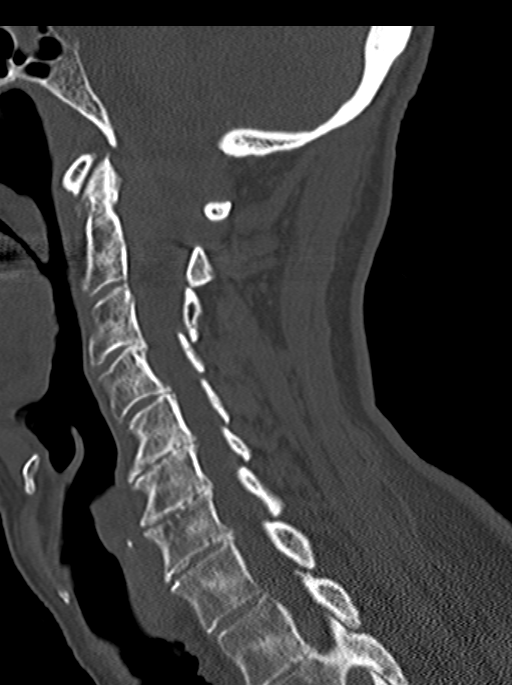
[im 31/61  soft-tissue]
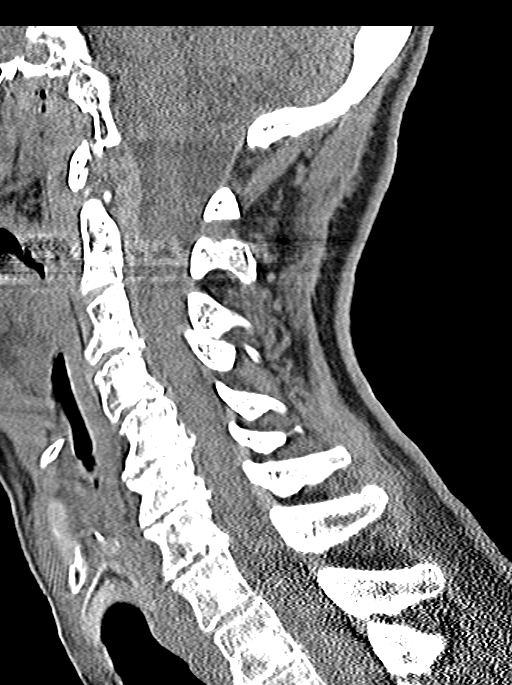
[im 31/61  bone]
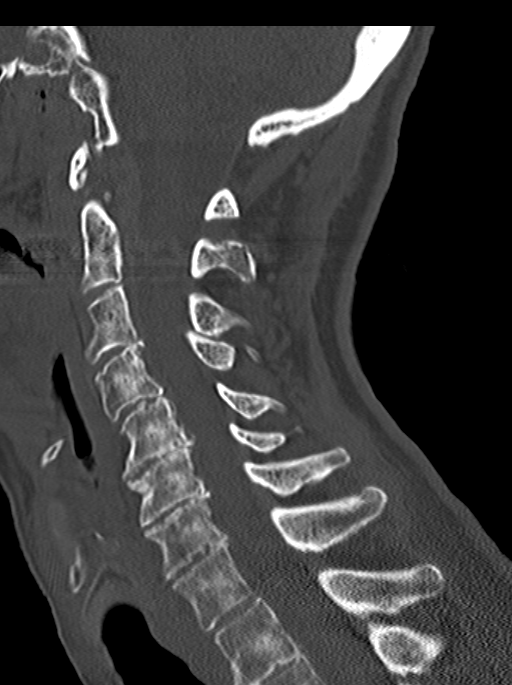
[im 36/61  bone]
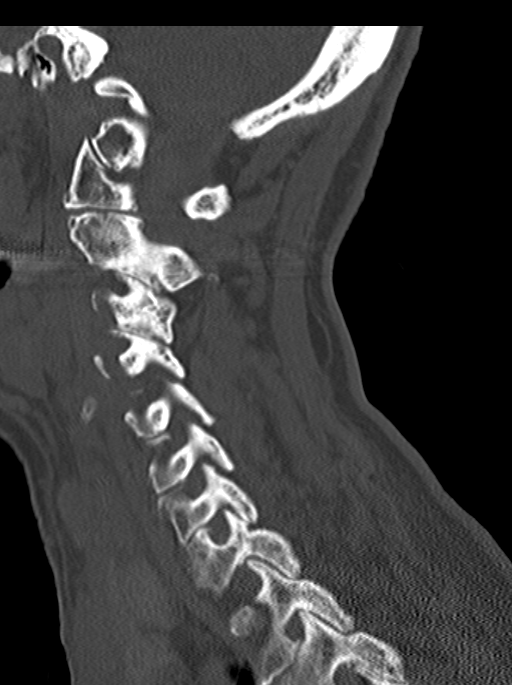
[im 41/61  bone]
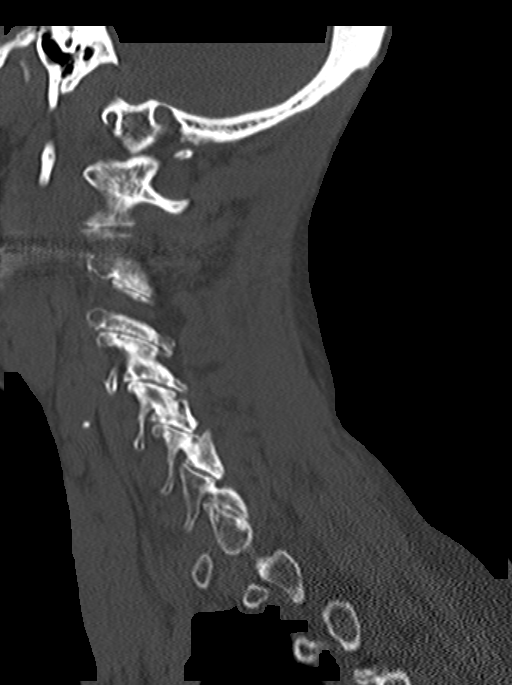

[13 of 33 positions shown; findings below may reference images not displayed]

FINDINGS: CT HEAD FINDINGS

Ventricles and cisterns are within normal. There is mild age related
atrophic change as well as chronic ischemic microvascular disease.
There is no mass, mass effect, shift of midline structures or acute
hemorrhage. No evidence of acute infarction. There is a right high
occipital scalp contusion. No evidence of fracture.

CT CERVICAL SPINE FINDINGS

Vertebral body alignment and heights are normal. There is moderate
spondylosis throughout the cervical spine. There is disc space
narrowing at the C4-5, C5-6, C6-7 and C7-T1 levels. Prevertebral
soft tissues are within normal. There is uncovertebral joint
spurring and facet arthropathy. Atlantoaxial articulation is within
normal. Bilateral neural foraminal narrowing at multiple levels due
to adjacent bony spurring.

There is very subtle irregularity and possible lucency over the
right lateral mass of C1 and adjacent right lateral mass of C2 as
cannot exclude subtle fractures in this location.
IMPRESSION: No acute intracranial findings.

Chronic ischemic microvascular disease and mild age related atrophic
change.

Subtle findings involving the right lateral mass of C1 and C2 as
cannot exclude subtle nondisplaced fractures in this location. MRI
may be helpful for further evaluation.

Moderate spondylosis of the cervical spinal with significant
multilevel disc disease and neural foraminal narrowing.

Critical Value/emergent results were called by telephone at the time
of interpretation on 07/15/2015 at [DATE] to Dr. KEI DONOSO ,
who verbally acknowledged these results.
